# Patient Record
Sex: Female | Born: 1980
Health system: Southern US, Community
[De-identification: ages and names within clinical notes are randomized; demographics above are authoritative.]

## PROBLEM LIST (undated history)

## (undated) DIAGNOSIS — N3090 Cystitis, unspecified without hematuria: Secondary | ICD-10-CM

## (undated) DIAGNOSIS — L255 Unspecified contact dermatitis due to plants, except food: Secondary | ICD-10-CM

## (undated) HISTORY — DX: Cystitis, unspecified without hematuria: N30.90

## (undated) HISTORY — DX: Unspecified contact dermatitis due to plants, except food: L25.5

---

## 2003-11-18 HISTORY — PX: TONSILLECTOMY: SUR1361

## 2018-10-16 ENCOUNTER — Encounter: Payer: Self-pay | Admitting: Legal Medicine

## 2018-10-16 DIAGNOSIS — Z124 Encounter for screening for malignant neoplasm of cervix: Secondary | ICD-10-CM | POA: Diagnosis not present

## 2018-10-16 DIAGNOSIS — Z0000 Encounter for general adult medical examination without abnormal findings: Secondary | ICD-10-CM | POA: Diagnosis not present

## 2020-02-26 ENCOUNTER — Ambulatory Visit: Payer: 59 | Admitting: Legal Medicine

## 2020-02-26 ENCOUNTER — Other Ambulatory Visit: Payer: Self-pay

## 2020-02-26 ENCOUNTER — Encounter: Payer: Self-pay | Admitting: Legal Medicine

## 2020-02-26 VITALS — BP 110/68 | HR 86 | Temp 97.9°F | Ht 67.0 in | Wt 146.0 lb

## 2020-02-26 DIAGNOSIS — T63421A Toxic effect of venom of ants, accidental (unintentional), initial encounter: Secondary | ICD-10-CM

## 2020-02-26 DIAGNOSIS — T7840XA Allergy, unspecified, initial encounter: Secondary | ICD-10-CM

## 2020-02-26 MED ORDER — TRIAMCINOLONE ACETONIDE 40 MG/ML IJ SUSP
80.0000 mg | Freq: Once | INTRAMUSCULAR | Status: AC
  Administered 2020-02-26: 80 mg via INTRAMUSCULAR

## 2020-02-26 MED ORDER — PREDNISONE 10 MG PO TABS: 10.0000 mg | ORAL_TABLET | ORAL | 0 refills | Status: DC

## 2020-02-26 NOTE — Progress Notes (Signed)
Subjective:  Patient ID: Emily Holt, female    DOB: 11/22/1980  Age: 39 y.o. MRN: [id number]  Chief Complaint  Patient presents with  . Insect Bite    Left ankle    HPI: bit by fire ants yesterday on left ankle.  It is now swollen and having paresthesia. No respiratory embarrassment.   Current Outpatient Medications on File Prior to Visit  Medication Sig Dispense Refill  . Etonogestrel (NEXPLANON [place]) Inject into the skin.     No current facility-administered medications on file prior to visit.   History reviewed. No pertinent past medical history. History reviewed. No pertinent surgical history.  History reviewed. No pertinent family history. Social History   Socioeconomic History  . Marital status: Single    Spouse name: Not on file  . Number of children: Not on file  . Years of education: Not on file  . Highest education level: Not on file  Occupational History  . Not on file  Tobacco Use  . Smoking status: Never Smoker  . Smokeless tobacco: Never Used  Vaping Use  . Vaping Use: Never used  Substance and Sexual Activity  . Alcohol use: Yes  . Drug use: Never  . Sexual activity: Not on file  Other Topics Concern  . Not on file  Social History Narrative  . Not on file   Social Determinants of Health   Financial Resource Strain:   . Difficulty of Paying Living Expenses:   Food Insecurity:   . Worried About Programme researcher, broadcasting/film/video in the Last Year:   . Barista in the Last Year:   Transportation Needs:   . Freight forwarder (Medical):   Marland Kitchen Lack of Transportation (Non-Medical):   Physical Activity:   . Days of Exercise per Week:   . Minutes of Exercise per Session:   Stress:   . Feeling of Stress :   Social Connections:   . Frequency of Communication with Friends and Family:   . Frequency of Social Gatherings with Friends and Family:   . Attends Religious Services:   . Active Member of Clubs or Organizations:   . Attends Banker  Meetings:   Marland Kitchen Marital Status:     Review of Systems  Constitutional: Negative.   Eyes: Negative.   Cardiovascular: Negative.   Gastrointestinal: Negative.   Genitourinary: Negative.   Musculoskeletal: Negative.   Skin: Positive for rash.       Edema left anke     Objective:  BP 110/68   Pulse 86   Temp 97.9 F (36.6 C)   Ht 5\' 7"  (1.702 m)   Wt 146 lb (66.2 kg)   SpO2 96%   BMI 22.87 kg/m   BP/Weight 02/26/2020  Systolic BP 110  Diastolic BP 68  Wt. (Lbs) 146  BMI 22.87    Physical Exam Vitals reviewed.  Constitutional:      Appearance: Normal appearance.  HENT:     Head: Normocephalic and atraumatic.  Cardiovascular:     Rate and Rhythm: Normal rate and regular rhythm.     Pulses: Normal pulses.     Heart sounds: Normal heart sounds.  Pulmonary:     Effort: Pulmonary effort is normal.     Breath sounds: Normal breath sounds.  Musculoskeletal:        General: Swelling present. Normal range of motion.     Cervical back: Normal range of motion and neck supple.  Skin:    Findings: Erythema  present.  Neurological:     Mental Status: She is alert.     Diabetic Foot Exam - Simple   No data filed       No results found for: WBC, HGB, HCT, PLT, GLUCOSE, CHOL, TRIG, HDL, LDLDIRECT, LDLCALC, ALT, AST, NA, K, CL, CREATININE, BUN, CO2, TSH, PSA, INR, GLUF, HGBA1C, MICROALBUR    Assessment & Plan:   1. Allergic reaction, initial encounter - triamcinolone acetonide (KENALOG-40) injection 80 mg - predniSONE (DELTASONE) 10 MG tablet; Take 1 tablet (10 mg total) by mouth as directed. Take 6ills first day , then 5 pills day 2 and then cut down one pill day until gone  Dispense: 21 tablet; Refill: 0 AN INDIVIDUAL CARE PLAN was established and reinforced today.  The patient's status was assessed using clinical findings on exam, labs, and other diagnostic testing. Patient's success at meeting treatment goals based on disease specific evidence-bassed guidelines and  found to be in fair control. RECOMMENDATIONS include prednisone pack and application of cool compresses and keep ankle elevated over weekend.    2. Fire ant bite, accidental or unintentional, initial encounter - triamcinolone acetonide (KENALOG-40) injection 80 mg - predniSONE (DELTASONE) 10 MG tablet; Take 1 tablet (10 mg total) by mouth as directed. Take 6ills first day , then 5 pills day 2 and then cut down one pill day until gone  Dispense: 21 tablet; Refill: 0 Avoidance discussed treat ground for fire ants to avoid recurrence   Meds ordered this encounter  Medications  . triamcinolone acetonide (KENALOG-40) injection 80 mg  . predniSONE (DELTASONE) 10 MG tablet    Sig: Take 1 tablet (10 mg total) by mouth as directed. Take 6ills first day , then 5 pills day 2 and then cut down one pill day until gone    Dispense:  21 tablet    Refill:  0       Follow-up: Return if symptoms worsen or fail to improve.  An After Visit Summary was printed and given to the patient.  Brent Bulla [facility] [phone]

## 2020-02-29 DIAGNOSIS — T63421A Toxic effect of venom of ants, accidental (unintentional), initial encounter: Secondary | ICD-10-CM | POA: Insufficient documentation

## 2020-02-29 DIAGNOSIS — T7840XA Allergy, unspecified, initial encounter: Secondary | ICD-10-CM | POA: Insufficient documentation

## 2020-03-04 ENCOUNTER — Other Ambulatory Visit: Payer: Self-pay

## 2020-03-04 DIAGNOSIS — T7840XA Allergy, unspecified, initial encounter: Secondary | ICD-10-CM

## 2020-03-04 MED ORDER — EPINEPHRINE 0.3 MG/0.3ML IJ SOAJ: 0.3000 mg | Freq: Once | INTRAMUSCULAR | Status: DC

## 2020-11-10 ENCOUNTER — Other Ambulatory Visit: Payer: Self-pay | Admitting: Legal Medicine

## 2020-11-10 ENCOUNTER — Other Ambulatory Visit: Payer: Self-pay

## 2020-11-10 ENCOUNTER — Ambulatory Visit (INDEPENDENT_AMBULATORY_CARE_PROVIDER_SITE_OTHER): Payer: Commercial Managed Care - PPO | Admitting: Legal Medicine

## 2020-11-10 ENCOUNTER — Encounter: Payer: Self-pay | Admitting: Legal Medicine

## 2020-11-10 VITALS — BP 102/64 | HR 73 | Temp 98.0°F | Resp 16 | Wt 141.0 lb

## 2020-11-10 DIAGNOSIS — Z0000 Encounter for general adult medical examination without abnormal findings: Secondary | ICD-10-CM

## 2020-11-10 LAB — POCT URINALYSIS DIP (CLINITEK)
Bilirubin, UA: NEGATIVE
Blood, UA: NEGATIVE
Glucose, UA: NEGATIVE mg/dL
Leukocytes, UA: NEGATIVE
Nitrite, UA: NEGATIVE
POC PROTEIN,UA: NEGATIVE
Spec Grav, UA: 1.015 (ref 1.010–1.025)
Urobilinogen, UA: 0.2 E.U./dL
pH, UA: 5.5 (ref 5.0–8.0)

## 2020-11-10 MED ORDER — EPINEPHRINE (ANAPHYLAXIS) 1 MG/ML IJ SOLN
INTRAMUSCULAR | 1 refills | Status: DC
Start: 2020-11-10 — End: 2020-11-10

## 2020-11-10 NOTE — Patient Instructions (Signed)
Preventive Care 21-39 Years Old, Female Preventive care refers to lifestyle choices and visits with your health care provider that can promote health and wellness. This includes:  A yearly physical exam. This is also called an annual wellness visit.  Regular dental and eye exams.  Immunizations.  Screening for certain conditions.  Healthy lifestyle choices, such as: ? Eating a healthy diet. ? Getting regular exercise. ? Not using drugs or products that contain nicotine and tobacco. ? Limiting alcohol use. What can I expect for my preventive care visit? Physical exam Your health care provider may check your:  Height and weight. These may be used to calculate your BMI (body mass index). BMI is a measurement that tells if you are at a healthy weight.  Heart rate and blood pressure.  Body temperature.  Skin for abnormal spots. Counseling Your health care provider may ask you questions about your:  Past medical problems.  Family's medical history.  Alcohol, tobacco, and drug use.  Emotional well-being.  Home life and relationship well-being.  Sexual activity.  Diet, exercise, and sleep habits.  Work and work environment.  Access to firearms.  Method of birth control.  Menstrual cycle.  Pregnancy history. What immunizations do I need? Vaccines are usually given at various ages, according to a schedule. Your health care provider will recommend vaccines for you based on your age, medical history, and lifestyle or other factors, such as travel or where you work.   What tests do I need? Blood tests  Lipid and cholesterol levels. These may be checked every 5 years starting at age 20.  Hepatitis C test.  Hepatitis B test. Screening  Diabetes screening. This is done by checking your blood sugar (glucose) after you have not eaten for a while (fasting).  STD (sexually transmitted disease) testing, if you are at risk.  BRCA-related cancer screening. This may be  done if you have a family history of breast, ovarian, tubal, or peritoneal cancers.  Pelvic exam and Pap test. This may be done every 3 years starting at age 21. Starting at age 30, this may be done every 5 years if you have a Pap test in combination with an HPV test. Talk with your health care provider about your test results, treatment options, and if necessary, the need for more tests.   Follow these instructions at home: Eating and drinking  Eat a healthy diet that includes fresh fruits and vegetables, whole grains, lean protein, and low-fat dairy products.  Take vitamin and mineral supplements as recommended by your health care provider.  Do not drink alcohol if: ? Your health care provider tells you not to drink. ? You are pregnant, may be pregnant, or are planning to become pregnant.  If you drink alcohol: ? Limit how much you have to 0-1 drink a day. ? Be aware of how much alcohol is in your drink. In the [place] one drink equals one 12 oz bottle of beer (355 mL), one 5 oz glass of wine (148 mL), or one 1 oz glass of hard liquor (44 mL).   Lifestyle  Take daily care of your teeth and gums. Brush your teeth every morning and night with fluoride toothpaste. Floss one time each day.  Stay active. Exercise for at least 30 minutes 5 or more days each week.  Do not use any products that contain nicotine or tobacco, such as cigarettes, e-cigarettes, and chewing tobacco. If you need help quitting, ask your health care provider.  Do not   use drugs.  If you are sexually active, practice safe sex. Use a condom or other form of protection to prevent STIs (sexually transmitted infections).  If you do not wish to become pregnant, use a form of birth control. If you plan to become pregnant, see your health care provider for a prepregnancy visit.  Find healthy ways to cope with stress, such as: ? Meditation, yoga, or listening to music. ? Journaling. ? Talking to a trusted  person. ? Spending time with friends and family. Safety  Always wear your seat belt while driving or riding in a vehicle.  Do not drive: ? If you have been drinking alcohol. Do not ride with someone who has been drinking. ? When you are tired or distracted. ? While texting.  Wear a helmet and other protective equipment during sports activities.  If you have firearms in your house, make sure you follow all gun safety procedures.  Seek help if you have been physically or sexually abused. What's next?  Go to your health care provider once a year for an annual wellness visit.  Ask your health care provider how often you should have your eyes and teeth checked.  Stay up to date on all vaccines. This information is not intended to replace advice given to you by your health care provider. Make sure you discuss any questions you have with your health care provider. Document Revised: 03/21/2020 Document Reviewed: 04/04/2018 Elsevier Patient Education  2021 [employer].

## 2020-11-10 NOTE — Progress Notes (Signed)
Subjective:  Patient ID: Emily Holt, female    DOB: 12-Sep-1980  Age: 40 y.o. MRN: [id number]  Chief Complaint  Patient presents with  . Annual Exam    HPI: patient is healthy, on nexplanon but no other medicines.  She works for [employer] and having foot pain due to rigid shoes.  She is having shin splints and pain on extensor aspect of ankles.  Needs alternate shoes.  She is a runner .   Current Outpatient Medications on File Prior to Visit  Medication Sig Dispense Refill  . Etonogestrel (NEXPLANON [place]) Inject into the skin.     No current facility-administered medications on file prior to visit.   Past Medical History:  Diagnosis Date  . Cystitis   . Dermatitis due to plant    Past Surgical History:  Procedure Laterality Date  . TONSILLECTOMY  11/18/2003    Family History  Problem Relation Age of Onset  . Hypertension Mother   . Diabetes Maternal Grandmother   . Diabetes Maternal Grandfather   . Colon cancer Maternal Grandfather    Social History   Socioeconomic History  . Marital status: Single    Spouse name: Not on file  . Number of children: 0  . Years of education: Not on file  . Highest education level: Not on file  Occupational History  . Not on file  Tobacco Use  . Smoking status: Never Smoker  . Smokeless tobacco: Never Used  Vaping Use  . Vaping Use: Never used  Substance and Sexual Activity  . Alcohol use: Yes    Alcohol/week: 2.0 standard drinks    Types: 2 Cans of beer per week    Comment: week  . Drug use: Never  . Sexual activity: Yes    Partners: Male  Other Topics Concern  . Not on file  Social History Narrative  . Not on file   Social Determinants of Health   Financial Resource Strain: Not on file  Food Insecurity: Not on file  Transportation Needs: Not on file  Physical Activity: Not on file  Stress: Not on file  Social Connections: Not on file    Review of Systems  Constitutional: Negative for activity change and  appetite change.  HENT: Negative for congestion.   Eyes: Negative for visual disturbance.  Respiratory: Negative for chest tightness and shortness of breath.   Cardiovascular: Negative for chest pain, palpitations and leg swelling.  Gastrointestinal: Negative.  Negative for abdominal distention and abdominal pain.  Endocrine: Negative for polyuria.  Genitourinary: Negative for difficulty urinating and dyspareunia.  Musculoskeletal: Negative for arthralgias and back pain.  Neurological: Negative.   Psychiatric/Behavioral: Negative.      Objective:  BP 102/64   Pulse 73   Temp 98 F (36.7 C)   Resp 16   Wt 141 lb (64 kg)   SpO2 99%   BMI 22.08 kg/m   BP/Weight 11/10/2020 02/26/2020  Systolic BP 102 110  Diastolic BP 64 68  Wt. (Lbs) 141 146  BMI 22.08 22.87    Physical Exam Vitals reviewed.  Constitutional:      Appearance: Normal appearance.  HENT:     Head: Normocephalic.     Right Ear: Tympanic membrane normal.     Left Ear: Tympanic membrane normal.     Nose: Nose normal.     Mouth/Throat:     Mouth: Mucous membranes are moist.     Pharynx: Oropharynx is clear.  Eyes:     Extraocular Movements:  Extraocular movements intact.     Conjunctiva/sclera: Conjunctivae normal.     Pupils: Pupils are equal, round, and reactive to light.  Cardiovascular:     Rate and Rhythm: Normal rate and regular rhythm.     Pulses: Normal pulses.     Heart sounds: Normal heart sounds. No murmur heard. No gallop.   Pulmonary:     Effort: Pulmonary effort is normal. No respiratory distress.     Breath sounds: Normal breath sounds. No rales.  Abdominal:     General: Abdomen is flat. Bowel sounds are normal. There is no distension.     Palpations: Abdomen is soft.     Tenderness: There is no abdominal tenderness.  Musculoskeletal:        General: Normal range of motion.     Cervical back: Normal range of motion and neck supple.       Legs:     Comments: Early ulcers on dorsum of  feet due to work shoes.  Skin:    General: Skin is warm and dry.     Capillary Refill: Capillary refill takes less than 2 seconds.  Neurological:     General: No focal deficit present.     Mental Status: She is alert and oriented to person, place, and time.  Psychiatric:        Mood and Affect: Mood normal.        Behavior: Behavior normal.        Thought Content: Thought content normal.       No results found for: WBC, HGB, HCT, PLT, GLUCOSE, CHOL, TRIG, HDL, LDLDIRECT, LDLCALC, ALT, AST, NA, K, CL, CREATININE, BUN, CO2, TSH, PSA, INR, GLUF, HGBA1C, MICROALBUR    Assessment & Plan:   Diagnoses and all orders for this visit: Routine general medical examination at a health care facility -     POCT URINALYSIS DIP (CLINITEK) -     CBC with Differential/Platelet -     Comprehensive metabolic panel -     Lipid panel Patient had physical, does not need pelvic.  Mammogram scheduled.         Follow-up: Return if symptoms worsen or fail to improve.  An After Visit Summary was printed and given to the patient.  Brent Bulla, MD [facility] [phone]

## 2020-11-11 LAB — CBC WITH DIFFERENTIAL/PLATELET
Basophils Absolute: 0.1 10*3/uL (ref 0.0–0.2)
Basos: 1 %
EOS (ABSOLUTE): 0.2 10*3/uL (ref 0.0–0.4)
Eos: 2 %
Hematocrit: 37.6 % (ref 34.0–46.6)
Hemoglobin: 12.6 g/dL (ref 11.1–15.9)
Immature Grans (Abs): 0 10*3/uL (ref 0.0–0.1)
Immature Granulocytes: 0 %
Lymphocytes Absolute: 3.3 10*3/uL — ABNORMAL HIGH (ref 0.7–3.1)
Lymphs: 38 %
MCH: 31.4 pg (ref 26.6–33.0)
MCHC: 33.5 g/dL (ref 31.5–35.7)
MCV: 94 fL (ref 79–97)
Monocytes Absolute: 0.6 10*3/uL (ref 0.1–0.9)
Monocytes: 7 %
Neutrophils Absolute: 4.7 10*3/uL (ref 1.4–7.0)
Neutrophils: 52 %
Platelets: 303 10*3/uL (ref 150–450)
RBC: 4.01 x10E6/uL (ref 3.77–5.28)
RDW: 12.8 % (ref 11.7–15.4)
WBC: 8.9 10*3/uL (ref 3.4–10.8)

## 2020-11-11 LAB — LIPID PANEL
Chol/HDL Ratio: 1.5 ratio (ref 0.0–4.4)
Cholesterol, Total: 153 mg/dL (ref 100–199)
HDL: 104 mg/dL (ref >39)
LDL Chol Calc (NIH): 39 mg/dL (ref 0–99)
Triglycerides: 43 mg/dL (ref 0–149)
VLDL Cholesterol Cal: 10 mg/dL (ref 5–40)

## 2020-11-11 LAB — COMPREHENSIVE METABOLIC PANEL
ALT: 14 IU/L (ref 0–32)
AST: 19 IU/L (ref 0–40)
Albumin/Globulin Ratio: 2.2 (ref 1.2–2.2)
Albumin: 4.8 g/dL (ref 3.8–4.8)
Alkaline Phosphatase: 57 IU/L (ref 44–121)
BUN/Creatinine Ratio: 16 (ref 9–23)
BUN: 11 mg/dL (ref 6–20)
Bilirubin Total: 0.6 mg/dL (ref 0.0–1.2)
CO2: 24 mmol/L (ref 20–29)
Calcium: 9.5 mg/dL (ref 8.7–10.2)
Chloride: 97 mmol/L (ref 96–106)
Creatinine, Ser: 0.68 mg/dL (ref 0.57–1.00)
Globulin, Total: 2.2 g/dL (ref 1.5–4.5)
Glucose: 82 mg/dL (ref 65–99)
Potassium: 4.7 mmol/L (ref 3.5–5.2)
Sodium: 136 mmol/L (ref 134–144)
Total Protein: 7 g/dL (ref 6.0–8.5)
eGFR: 115 mL/min/{1.73_m2} (ref >59)

## 2020-11-11 LAB — CARDIOVASCULAR RISK ASSESSMENT

## 2020-11-11 NOTE — Progress Notes (Signed)
CBC normal, kidney and liver tests normal, glucose 82 normal, Cholesterol normal,  lp

## 2020-11-29 ENCOUNTER — Ambulatory Visit
Admission: RE | Admit: 2020-11-29 | Discharge: 2020-11-29 | Disposition: A | Discharge status: Home / Self Care | Payer: Commercial Managed Care - PPO | Source: Ambulatory Visit | Attending: Legal Medicine | Admitting: Legal Medicine

## 2020-11-29 ENCOUNTER — Other Ambulatory Visit: Payer: Self-pay

## 2020-11-29 ENCOUNTER — Other Ambulatory Visit: Payer: Self-pay | Admitting: Legal Medicine

## 2020-11-29 DIAGNOSIS — Z1231 Encounter for screening mammogram for malignant neoplasm of breast: Secondary | ICD-10-CM

## 2020-11-30 NOTE — Progress Notes (Signed)
Normal mammogram ?lp

## 2020-12-20 ENCOUNTER — Other Ambulatory Visit: Payer: Self-pay

## 2020-12-20 ENCOUNTER — Encounter: Payer: Self-pay | Admitting: Legal Medicine

## 2020-12-20 ENCOUNTER — Ambulatory Visit: Payer: Commercial Managed Care - PPO | Admitting: Legal Medicine

## 2020-12-20 VITALS — BP 104/68 | HR 80 | Temp 97.5°F | Resp 16 | Ht 67.0 in | Wt 142.0 lb

## 2020-12-20 DIAGNOSIS — M545 Low back pain, unspecified: Secondary | ICD-10-CM | POA: Diagnosis not present

## 2020-12-20 MED ORDER — PREDNISONE 10 MG (21) PO TBPK: ORAL_TABLET | ORAL | 0 refills | Status: DC

## 2020-12-20 NOTE — Patient Instructions (Signed)

## 2020-12-20 NOTE — Progress Notes (Signed)
Subjective:  Patient ID: Emily Holt, female    DOB: 1981/05/14  Age: 40 y.o. MRN: [id number]  Chief Complaint  Patient presents with  . Back Pain    HPI: 2 years ago "pinch" in back.  Since [place] , she gets sharp pain in mid lumbar area.  No weakness or numbness in legs.  No bladder or bowel problems. No injury. She is unable to run due to pain.   Current Outpatient Medications on File Prior to Visit  Medication Sig Dispense Refill  . EPINEPHrine (ADRENALIN) 1 MG/ML SOLN INJECT 1 MG/ML INTRAMUSCULAR FOR ANAPHYLAXIS AS DIRECTED. 1 mL 3  . Etonogestrel (NEXPLANON [place]) Inject into the skin.     No current facility-administered medications on file prior to visit.   Past Medical History:  Diagnosis Date  . Cystitis   . Dermatitis due to plant    Past Surgical History:  Procedure Laterality Date  . TONSILLECTOMY  11/18/2003    Family History  Problem Relation Age of Onset  . Hypertension Mother   . Diabetes Maternal Grandmother   . Diabetes Maternal Grandfather   . Colon cancer Maternal Grandfather    Social History   Socioeconomic History  . Marital status: Single    Spouse name: Not on file  . Number of children: 0  . Years of education: Not on file  . Highest education level: Not on file  Occupational History  . Not on file  Tobacco Use  . Smoking status: Never Smoker  . Smokeless tobacco: Never Used  Vaping Use  . Vaping Use: Never used  Substance and Sexual Activity  . Alcohol use: Yes    Alcohol/week: 2.0 standard drinks    Types: 2 Cans of beer per week    Comment: week  . Drug use: Never  . Sexual activity: Yes    Partners: Male  Other Topics Concern  . Not on file  Social History Narrative  . Not on file   Social Determinants of Health   Financial Resource Strain: Not on file  Food Insecurity: Not on file  Transportation Needs: Not on file  Physical Activity: Not on file  Stress: Not on file  Social Connections: Not on file    Review  of Systems  Constitutional: Negative for activity change and appetite change.  HENT: Negative for congestion and sinus pain.   Eyes: Negative for visual disturbance.  Respiratory: Negative for chest tightness and shortness of breath.   Cardiovascular: Negative for chest pain, palpitations and leg swelling.  Gastrointestinal: Negative for abdominal distention and abdominal pain.  Endocrine: Negative for polyuria.  Genitourinary: Negative for difficulty urinating and dysuria.  Musculoskeletal: Negative for arthralgias and back pain.  Skin: Negative.   Neurological: Negative.   Psychiatric/Behavioral: Negative.      Objective:  BP 104/68   Pulse 80   Temp (!) 97.5 F (36.4 C)   Resp 16   Ht 5\' 7"  (1.702 m)   Wt 142 lb (64.4 kg)   SpO2 94%   BMI 22.24 kg/m   BP/Weight 12/20/2020 11/10/2020 02/26/2020  Systolic BP 104 102 110  Diastolic BP 68 64 68  Wt. (Lbs) 142 141 146  BMI 22.24 22.08 22.87    Physical Exam Vitals reviewed.  Constitutional:      Appearance: Normal appearance. She is normal weight.  HENT:     Head: Normocephalic.     Right Ear: Tympanic membrane, ear canal and external ear normal.     Left Ear:  Tympanic membrane and external ear normal.     Mouth/Throat:     Mouth: Mucous membranes are moist.     Pharynx: Oropharynx is clear.  Eyes:     Extraocular Movements: Extraocular movements intact.     Conjunctiva/sclera: Conjunctivae normal.     Pupils: Pupils are equal, round, and reactive to light.  Cardiovascular:     Rate and Rhythm: Normal rate and regular rhythm.     Pulses: Normal pulses.     Heart sounds: Normal heart sounds. No murmur heard. No gallop.   Pulmonary:     Effort: Pulmonary effort is normal. No respiratory distress.     Breath sounds: No rales.  Abdominal:     General: Abdomen is flat. Bowel sounds are normal. There is no distension.     Palpations: Abdomen is soft.     Tenderness: There is no abdominal tenderness.  Musculoskeletal:      Cervical back: Normal range of motion.     Lumbar back: Spasms and tenderness present. No bony tenderness. Normal range of motion. Negative right straight leg raise test and negative left straight leg raise test. No scoliosis.       Back:     Comments: Full ROM back flexion 20 degrees and extension 10 degrees  Skin:    Capillary Refill: Capillary refill takes less than 2 seconds.  Neurological:     General: No focal deficit present.     Mental Status: She is alert and oriented to person, place, and time.       Lab Results  Component Value Date   WBC 8.9 11/10/2020   HGB 12.6 11/10/2020   HCT 37.6 11/10/2020   PLT 303 11/10/2020   GLUCOSE 82 11/10/2020   CHOL 153 11/10/2020   TRIG 43 11/10/2020   HDL 104 11/10/2020   LDLCALC 39 11/10/2020   ALT 14 11/10/2020   AST 19 11/10/2020   NA 136 11/10/2020   K 4.7 11/10/2020   CL 97 11/10/2020   CREATININE 0.68 11/10/2020   BUN 11 11/10/2020   CO2 24 11/10/2020      Assessment & Plan:   Diagnoses and all orders for this visit: Lumbar pain -     DG Lumbar Spine Complete -     predniSONE (STERAPRED UNI-PAK 21 TAB) 10 MG (21) TBPK tablet; Take 6ills first day , then 5 pills day 2 and then cut down one pill day until gone I suspect a nerve impingement with her sharp pain.  No radiation or neurologic defects.  X-ray was negative of LS spine     Follow-up: Return in about 1 week (around 12/27/2020) for back pain.  An After Visit Summary was printed and given to the patient.  Brent Bulla, MD [facility] [phone]

## 2020-12-27 ENCOUNTER — Ambulatory Visit: Payer: Commercial Managed Care - PPO | Admitting: Legal Medicine

## 2021-05-11 ENCOUNTER — Encounter: Payer: Self-pay | Admitting: Physician Assistant

## 2021-05-11 ENCOUNTER — Ambulatory Visit: Payer: Commercial Managed Care - PPO | Admitting: Physician Assistant

## 2021-05-11 ENCOUNTER — Other Ambulatory Visit: Payer: Self-pay

## 2021-05-11 VITALS — BP 116/70 | HR 80 | Temp 96.4°F | Ht 67.0 in | Wt 146.6 lb

## 2021-05-11 DIAGNOSIS — A630 Anogenital (venereal) warts: Secondary | ICD-10-CM | POA: Diagnosis not present

## 2021-05-11 MED ORDER — PODOFILOX 0.5 % EX SOLN: CUTANEOUS | 0 refills | Status: DC

## 2021-05-11 NOTE — Progress Notes (Signed)
Acute Office Visit  Subjective:    Patient ID: Emily Holt, female    DOB: 02-20-1981, 40 y.o.   MRN: [id number]  Chief Complaint  Patient presents with   Genital Warts    HPI Patient is in today for complaints of genital warts - states she was diagnosed about 10-12 years ago and treated with topical meds - had no recurrence until now She does not have a new sexual partner and he has no symptoms She is up to date on pap with last one in 2020 and negative for HPV Pt does states that she shaves her vaginal area and the initial lesions started higher on mons pubis area - these are more flat also  Past Medical History:  Diagnosis Date   Cystitis    Dermatitis due to plant     Past Surgical History:  Procedure Laterality Date   TONSILLECTOMY  11/18/2003    Family History  Problem Relation Age of Onset   Hypertension Mother    Diabetes Maternal Grandmother    Diabetes Maternal Grandfather    Colon cancer Maternal Grandfather     Social History   Socioeconomic History   Marital status: Single    Spouse name: Not on file   Number of children: 0   Years of education: Not on file   Highest education level: Not on file  Occupational History   Not on file  Tobacco Use   Smoking status: Never   Smokeless tobacco: Never  Vaping Use   Vaping Use: Never used  Substance and Sexual Activity   Alcohol use: Yes    Alcohol/week: 2.0 standard drinks    Types: 2 Cans of beer per week    Comment: week   Drug use: Never   Sexual activity: Yes    Partners: Male  Other Topics Concern   Not on file  Social History Narrative   Not on file   Social Determinants of Health   Financial Resource Strain: Not on file  Food Insecurity: Not on file  Transportation Needs: Not on file  Physical Activity: Not on file  Stress: Not on file  Social Connections: Not on file  Intimate Partner Violence: Not on file    Outpatient Medications Prior to Visit  Medication Sig Dispense  Refill   EPINEPHrine (ADRENALIN) 1 MG/ML SOLN INJECT 1 MG/ML INTRAMUSCULAR FOR ANAPHYLAXIS AS DIRECTED. 1 mL 3   Etonogestrel (NEXPLANON [place]) Inject into the skin.     predniSONE (STERAPRED UNI-PAK 21 TAB) 10 MG (21) TBPK tablet Take 6ills first day , then 5 pills day 2 and then cut down one pill day until gone 21 tablet 0   No facility-administered medications prior to visit.    No Known Allergies  Review of Systems CONSTITUTIONAL: Negative for chills, fatigue, fever, unintentional weight gain and unintentional weight loss.  CARDIOVASCULAR: Negative for chest pain, dizziness, palpitations and pedal edema.  RESPIRATORY: Negative for recent cough and dyspnea.  GU - no vaginal discharge or itching --- skin lesions as mentioned above        Objective:    Physical Exam  BP 116/70 (BP Location: Right Arm, Patient Position: Sitting, Cuff Size: Normal)   Pulse 80   Temp (!) 96.4 F (35.8 C) (Temporal)   Ht '5\' 7"'  (1.702 m)   Wt 146 lb 9.6 oz (66.5 kg)   SpO2 95%   BMI 22.96 kg/m  Wt Readings from Last 3 Encounters:  05/11/21 146 lb 9.6 oz (66.5 kg)  12/20/20 142 lb (64.4 kg)  11/10/20 141 lb (64 kg)  PHYSICAL EXAM:   VS: BP 116/70 (BP Location: Right Arm, Patient Position: Sitting, Cuff Size: Normal)   Pulse 80   Temp (!) 96.4 F (35.8 C) (Temporal)   Ht '5\' 7"'  (1.702 m)   Wt 146 lb 9.6 oz (66.5 kg)   SpO2 95%   BMI 22.96 kg/m   GEN: Well nourished, well developed, in no acute distress  Cardiac: RRR; no murmurs, rubs, or gallops,no edema -  Respiratory:  normal respiratory rate and pattern with no distress - normal breath sounds with no rales, rhonchi, wheezes or rubs GU - small flat white planar wart like lesions noted around labia ---    Health Maintenance Due  Topic Date Due   HIV Screening  Never done   Hepatitis C Screening  Never done   TETANUS/TDAP  Never done   INFLUENZA VACCINE  Never done    There are no preventive care reminders to display for this  patient.   No results found for: TSH Lab Results  Component Value Date   WBC 8.9 11/10/2020   HGB 12.6 11/10/2020   HCT 37.6 11/10/2020   MCV 94 11/10/2020   PLT 303 11/10/2020   Lab Results  Component Value Date   NA 136 11/10/2020   K 4.7 11/10/2020   CO2 24 11/10/2020   GLUCOSE 82 11/10/2020   BUN 11 11/10/2020   CREATININE 0.68 11/10/2020   BILITOT 0.6 11/10/2020   ALKPHOS 57 11/10/2020   AST 19 11/10/2020   ALT 14 11/10/2020   PROT 7.0 11/10/2020   ALBUMIN 4.8 11/10/2020   CALCIUM 9.5 11/10/2020   EGFR 115 11/10/2020   Lab Results  Component Value Date   CHOL 153 11/10/2020   Lab Results  Component Value Date   HDL 104 11/10/2020   Lab Results  Component Value Date   LDLCALC 39 11/10/2020   Lab Results  Component Value Date   TRIG 43 11/10/2020   Lab Results  Component Value Date   CHOLHDL 1.5 11/10/2020   No results found for: HGBA1C     Assessment & Plan:   Problem List Items Addressed This Visit   None Visit Diagnoses     Genital warts    -  Primary   Relevant Medications   podofilox (CONDYLOX) 0.5 % external solution      Meds ordered this encounter  Medications   podofilox (CONDYLOX) 0.5 % external solution    Sig: Apply topically every 12 hours in the morning and evening for 3 days, then withhold for 4 days; repeat cycle up to 4 times    Dispense:  3.5 mL    Refill:  0    Order Specific Question:   Supervising Provider    Answer:   Rochel Brome [id number]    No orders of the defined types were placed in this encounter.    Follow-up: Return if symptoms worsen or fail to improve.  An After Visit Summary was printed and given to the patient.  Yetta Flock [facility] [phone]

## 2021-08-29 ENCOUNTER — Telehealth (INDEPENDENT_AMBULATORY_CARE_PROVIDER_SITE_OTHER): Payer: Commercial Managed Care - PPO | Admitting: Nurse Practitioner

## 2021-08-29 ENCOUNTER — Encounter: Payer: Self-pay | Admitting: Nurse Practitioner

## 2021-08-29 DIAGNOSIS — R509 Fever, unspecified: Secondary | ICD-10-CM | POA: Diagnosis not present

## 2021-08-29 DIAGNOSIS — U071 COVID-19: Secondary | ICD-10-CM

## 2021-08-29 DIAGNOSIS — R112 Nausea with vomiting, unspecified: Secondary | ICD-10-CM

## 2021-08-29 LAB — POCT RAPID STREP A (OFFICE): Rapid Strep A Screen: NEGATIVE

## 2021-08-29 LAB — POC COVID19 BINAXNOW: SARS Coronavirus 2 Ag: POSITIVE — AB

## 2021-08-29 LAB — POCT INFLUENZA A/B
Influenza A, POC: NEGATIVE
Influenza B, POC: NEGATIVE

## 2021-08-29 MED ORDER — IBUPROFEN 800 MG PO TABS: 800.0000 mg | ORAL_TABLET | Freq: Three times a day (TID) | ORAL | 0 refills | Status: DC | PRN

## 2021-08-29 MED ORDER — NIRMATRELVIR/RITONAVIR (PAXLOVID)TABLET: 3.0000 | ORAL_TABLET | Freq: Two times a day (BID) | ORAL | 0 refills | Status: AC

## 2021-08-29 MED ORDER — ONDANSETRON HCL 4 MG PO TABS: 4.0000 mg | ORAL_TABLET | Freq: Three times a day (TID) | ORAL | 0 refills | Status: DC | PRN

## 2021-08-29 NOTE — Progress Notes (Signed)
Virtual Visit via Video Note   This visit type was conducted due to national recommendations for restrictions regarding the COVID-19 Pandemic (e.g. social distancing) in an effort to limit this patient's exposure and mitigate transmission in our community.  Due to her co-morbid illnesses, this patient is at least at moderate risk for complications without adequate follow up.  This format is felt to be most appropriate for this patient at this time.  All issues noted in this document were discussed and addressed.  A limited physical exam was performed with this format.  A verbal consent was obtained for the virtual visit.   Date:  08/29/2021   ID:  Emily Holt, DOB [birth year], MRN [id number]  Patient Location: Other:  [facility]  Provider Location: Office/Clinic  PCP:  Abigail Miyamoto, MD   Evaluation Performed:  Established patient, acute telemedicine visit  Chief Complaint:  fever  History of Present Illness:    Emily Holt is a 41 y.o. female with fever, nausea, vomiting, and back pain. Onset of symptoms was yesterday. Treatment has included Ibuprofen.   The patient does have symptoms concerning for COVID-19 infection (fever, chills, cough, or new shortness of breath).    Past Medical History:  Diagnosis Date   Cystitis    Dermatitis due to plant     Past Surgical History:  Procedure Laterality Date   TONSILLECTOMY  11/18/2003    Family History  Problem Relation Age of Onset   Hypertension Mother    Diabetes Maternal Grandmother    Diabetes Maternal Grandfather    Colon cancer Maternal Grandfather     Social History   Socioeconomic History   Marital status: Single    Spouse name: Not on file   Number of children: 0   Years of education: Not on file   Highest education level: Not on file  Occupational History   Not on file  Tobacco Use   Smoking status: Never   Smokeless tobacco: Never  Vaping Use   Vaping Use: Never used  Substance  and Sexual Activity   Alcohol use: Yes    Alcohol/week: 2.0 standard drinks    Types: 2 Cans of beer per week    Comment: week   Drug use: Never   Sexual activity: Yes    Partners: Male  Other Topics Concern   Not on file  Social History Narrative   Not on file   Social Determinants of Health   Financial Resource Strain: Not on file  Food Insecurity: Not on file  Transportation Needs: Not on file  Physical Activity: Not on file  Stress: Not on file  Social Connections: Not on file  Intimate Partner Violence: Not on file    Outpatient Medications Prior to Visit  Medication Sig Dispense Refill   EPINEPHrine (ADRENALIN) 1 MG/ML SOLN INJECT 1 MG/ML INTRAMUSCULAR FOR ANAPHYLAXIS AS DIRECTED. 1 mL 3   Etonogestrel (NEXPLANON [place]) Inject into the skin.     podofilox (CONDYLOX) 0.5 % external solution Apply topically every 12 hours in the morning and evening for 3 days, then withhold for 4 days; repeat cycle up to 4 times 3.5 mL 0   predniSONE (STERAPRED UNI-PAK 21 TAB) 10 MG (21) TBPK tablet Take 6ills first day , then 5 pills day 2 and then cut down one pill day until gone 21 tablet 0   No facility-administered medications prior to visit.    Allergies:   Patient has no known allergies.   Social History  Tobacco Use   Smoking status: Never   Smokeless tobacco: Never  Vaping Use   Vaping Use: Never used  Substance Use Topics   Alcohol use: Yes    Alcohol/week: 2.0 standard drinks    Types: 2 Cans of beer per week    Comment: week   Drug use: Never     Review of Systems  Constitutional:  Positive for chills, fever and malaise/fatigue.  Gastrointestinal:  Positive for nausea and vomiting.  Musculoskeletal:  Positive for back pain.  All other systems reviewed and are negative.   Labs/Other Tests and Data Reviewed:    Recent Labs: 11/10/2020: ALT 14; BUN 11; Creatinine, Ser 0.68; Hemoglobin 12.6; Platelets 303; Potassium 4.7; Sodium 136   Recent Lipid Panel Lab  Results  Component Value Date/Time   CHOL 153 11/10/2020 02:35 PM   TRIG 43 11/10/2020 02:35 PM   HDL 104 11/10/2020 02:35 PM   CHOLHDL 1.5 11/10/2020 02:35 PM   LDLCALC 39 11/10/2020 02:35 PM    Wt Readings from Last 3 Encounters:  05/11/21 146 lb 9.6 oz (66.5 kg)  12/20/20 142 lb (64.4 kg)  11/10/20 141 lb (64 kg)     Objective:    Vital Signs:  There were no vitals taken for this visit.   Physical Exam No physical exam due to telemedicine visit  ASSESSMENT & PLAN:       1. COVID-19 - nirmatrelvir/ritonavir EUA (PAXLOVID) 20 x 150 MG & 10 x 100MG  TABS; Take 3 tablets by mouth 2 (two) times daily for 5 days. (Take nirmatrelvir 150 mg two tablets twice daily for 5 days and ritonavir 100 mg one tablet twice daily for 5 days) Patient GFR is 115  Dispense: 30 tablet; Refill: 0  2. Fever, unspecified fever cause - POC COVID-19 BinaxNow-POSITIVE - POCT Influenza A/B-NEGATIVE - POCT rapid strep A-NEGATIVE - ibuprofen (ADVIL) 800 MG tablet; Take 1 tablet (800 mg total) by mouth every 8 (eight) hours as needed.  Dispense: 30 tablet; Refill: 0  3. Nausea and vomiting, unspecified vomiting type - ondansetron (ZOFRAN) 4 MG tablet; Take 1 tablet (4 mg total) by mouth every 8 (eight) hours as needed for nausea or vomiting.  Dispense: 20 tablet; Refill: 0    Rest and push fluids Seek emergency medical care for severe symptoms Continue Ibuprofen as directed for fever, back pain Take OTC cold remedies for URI symptoms such as Mucinex Follow-up as needed    COVID-19 Education: The signs and symptoms of COVID-19 were discussed with the patient and how to seek care for testing (follow up with PCP or arrange E-visit). The importance of social distancing was discussed today.   I spent 10 minutes dedicated to the care of this patient on the date of this encounter to include face-to-face time with the patient, as well as: EMR review and prescription medication management  Follow Up:  In  Person prn  Signed, , NP  08/29/2021 2:49 PM    [facility] [place]

## 2021-09-02 ENCOUNTER — Telehealth: Payer: Self-pay

## 2021-09-02 NOTE — Telephone Encounter (Signed)
Made patient aware.   Lorita Officer, CCMA 09/02/21 11:14 AM

## 2021-09-02 NOTE — Telephone Encounter (Signed)
Patient was dx w/ covid on 1/23 was given paxlovid and ibuprofen. She has been taking these. Overall symptoms have decreased but throat pain has worsened. Ibuprofen does not help. Please advise.   Lorita Officer, CCMA 09/02/21 8:08 AM

## 2022-04-13 ENCOUNTER — Encounter: Payer: Self-pay | Admitting: Nurse Practitioner

## 2022-04-13 ENCOUNTER — Ambulatory Visit: Payer: Commercial Managed Care - PPO | Admitting: Nurse Practitioner

## 2022-04-13 VITALS — BP 108/68 | HR 68 | Temp 97.9°F | Ht 67.0 in | Wt 139.0 lb

## 2022-04-13 DIAGNOSIS — J018 Other acute sinusitis: Secondary | ICD-10-CM | POA: Diagnosis not present

## 2022-04-13 DIAGNOSIS — J302 Other seasonal allergic rhinitis: Secondary | ICD-10-CM | POA: Diagnosis not present

## 2022-04-13 MED ORDER — AZITHROMYCIN 250 MG PO TABS: ORAL_TABLET | ORAL | 0 refills | Status: AC

## 2022-04-13 MED ORDER — FLUTICASONE PROPIONATE 50 MCG/ACT NA SUSP: 2.0000 | Freq: Every day | NASAL | 6 refills | Status: DC

## 2022-04-13 NOTE — Progress Notes (Signed)
Acute Office Visit  Subjective:    Patient ID: Emily Holt, female    DOB: [birth year], 41 y.o.   MRN: [id number]  Chief Complaint  Patient presents with   Sinusitis   HPI:  Sinusitis Associated symptoms include congestion, ear pain, headaches, sinus pressure and a sore throat. Pertinent negatives include no coughing or shortness of breath.   Patient is in today for sinus congestion/pressure, headache, sore throat, and bilateral ear pain. Onset of symptoms was 6 days ago. Treatment has included Zyrtec and Ibuprofen. Denies fever, chills, rash, or body aches. Denies known exposure to ill contacts. She works outdoors as a Dispensing optician. States she has chronic allergic rhinitis.   Past Medical History:  Diagnosis Date   Cystitis    Dermatitis due to plant     Past Surgical History:  Procedure Laterality Date   TONSILLECTOMY  11/18/2003    Family History  Problem Relation Age of Onset   Hypertension Mother    Diabetes Maternal Grandmother    Diabetes Maternal Grandfather    Colon cancer Maternal Grandfather     Social History   Socioeconomic History   Marital status: Single    Spouse name: Not on file   Number of children: 0   Years of education: Not on file   Highest education level: Not on file  Occupational History   Not on file  Tobacco Use   Smoking status: Never   Smokeless tobacco: Never  Vaping Use   Vaping Use: Never used  Substance and Sexual Activity   Alcohol use: Yes    Alcohol/week: 2.0 standard drinks of alcohol    Types: 2 Cans of beer per week    Comment: week   Drug use: Never   Sexual activity: Yes    Partners: Male  Other Topics Concern   Not on file  Social History Narrative   Not on file   Social Determinants of Health   Financial Resource Strain: Not on file  Food Insecurity: Not on file  Transportation Needs: Not on file  Physical Activity: Not on file  Stress: Not on file  Social Connections: Not on file  Intimate  Partner Violence: Not on file    Outpatient Medications Prior to Visit  Medication Sig Dispense Refill   EPINEPHrine (ADRENALIN) 1 MG/ML SOLN INJECT 1 MG/ML INTRAMUSCULAR FOR ANAPHYLAXIS AS DIRECTED. 1 mL 3   Etonogestrel (NEXPLANON [place]) Inject into the skin.     ibuprofen (ADVIL) 800 MG tablet Take 1 tablet (800 mg total) by mouth every 8 (eight) hours as needed. 30 tablet 0   nitrofurantoin, macrocrystal-monohydrate, (MACROBID) 100 MG capsule Take 100 mg by mouth 2 (two) times daily.     ondansetron (ZOFRAN) 4 MG tablet Take 1 tablet (4 mg total) by mouth every 8 (eight) hours as needed for nausea or vomiting. 20 tablet 0   podofilox (CONDYLOX) 0.5 % external solution Apply topically every 12 hours in the morning and evening for 3 days, then withhold for 4 days; repeat cycle up to 4 times 3.5 mL 0   predniSONE (STERAPRED UNI-PAK 21 TAB) 10 MG (21) TBPK tablet Take 6ills first day , then 5 pills day 2 and then cut down one pill day until gone 21 tablet 0   No facility-administered medications prior to visit.    No Known Allergies  Review of Systems  Constitutional:  Positive for fatigue. Negative for appetite change and fever.  HENT:  Positive for congestion, ear pain, postnasal drip,  rhinorrhea, sinus pressure, sinus pain and sore throat.   Eyes:  Positive for pain (bilateral).  Respiratory:  Negative for cough, chest tightness, shortness of breath and wheezing.   Cardiovascular:  Negative for chest pain and palpitations.  Gastrointestinal:  Negative for abdominal pain, constipation, diarrhea, nausea and vomiting.  Genitourinary:  Negative for dysuria and hematuria.  Musculoskeletal:  Negative for arthralgias, back pain, joint swelling and myalgias.  Skin:  Negative for rash.  Allergic/Immunologic: Positive for environmental allergies.  Neurological:  Positive for headaches. Negative for dizziness and weakness.  Psychiatric/Behavioral:  Negative for dysphoric mood. The patient is not  nervous/anxious.        Objective:    Physical Exam Vitals reviewed.  Constitutional:      Appearance: She is ill-appearing.  HENT:     Right Ear: Tympanic membrane normal.     Left Ear: Tympanic membrane is erythematous.     Nose:     Right Sinus: Maxillary sinus tenderness and frontal sinus tenderness present.     Left Sinus: Maxillary sinus tenderness and frontal sinus tenderness present.     Mouth/Throat:     Pharynx: Posterior oropharyngeal erythema present.  Cardiovascular:     Rate and Rhythm: Normal rate and regular rhythm.     Pulses: Normal pulses.     Heart sounds: Normal heart sounds.  Pulmonary:     Effort: Pulmonary effort is normal.     Breath sounds: Normal breath sounds.  Abdominal:     General: Abdomen is flat. Bowel sounds are normal.     Palpations: Abdomen is soft.  Neurological:     Mental Status: She is alert and oriented to person, place, and time.  Psychiatric:        Mood and Affect: Mood normal.        Behavior: Behavior normal.     BP 108/68 (BP Location: Left Arm, Patient Position: Sitting)   Pulse 68   Temp 97.9 F (36.6 C) (Temporal)   Ht '5\' 7"'  (1.702 m)   Wt 139 lb (63 kg)   SpO2 98%   BMI 21.77 kg/m  Wt Readings from Last 3 Encounters:  04/13/22 139 lb (63 kg)  05/11/21 146 lb 9.6 oz (66.5 kg)  12/20/20 142 lb (64.4 kg)    Health Maintenance Due  Topic Date Due   HIV Screening  Never done   Hepatitis C Screening  Never done   TETANUS/TDAP  Never done   PAP SMEAR-Modifier  10/15/2021   INFLUENZA VACCINE  Never done    Lab Results  Component Value Date   WBC 8.9 11/10/2020   HGB 12.6 11/10/2020   HCT 37.6 11/10/2020   MCV 94 11/10/2020   PLT 303 11/10/2020   Lab Results  Component Value Date   NA 136 11/10/2020   K 4.7 11/10/2020   CO2 24 11/10/2020   GLUCOSE 82 11/10/2020   BUN 11 11/10/2020   CREATININE 0.68 11/10/2020   BILITOT 0.6 11/10/2020   ALKPHOS 57 11/10/2020   AST 19 11/10/2020   ALT 14 11/10/2020    PROT 7.0 11/10/2020   ALBUMIN 4.8 11/10/2020   CALCIUM 9.5 11/10/2020   EGFR 115 11/10/2020   Lab Results  Component Value Date   CHOL 153 11/10/2020   Lab Results  Component Value Date   HDL 104 11/10/2020   Lab Results  Component Value Date   LDLCALC 39 11/10/2020   Lab Results  Component Value Date   TRIG 43 11/10/2020  Lab Results  Component Value Date   CHOLHDL 1.5 11/10/2020          Assessment & Plan:   1. Acute non-recurrent sinusitis of other sinus - azithromycin (ZITHROMAX) 250 MG tablet; Take 2 tablets on day 1, then 1 tablet daily on days 2 through 5  Dispense: 6 tablet; Refill: 0  2. Seasonal allergic rhinitis, unspecified trigger - fluticasone (FLONASE) 50 MCG/ACT nasal spray; Place 2 sprays into both nostrils daily.  Dispense: 16 g; Refill: 6   Take Z-pack as prescribed Rest and push fluids Continue Zyrtec as directed Begin Flonase nasal spray Take Tylenol or Ibuprofen as needed for headache  I,Lauren M Auman,acting as a scribe for [employer], NP.,have documented all relevant documentation on the behalf of Rip Harbour, NP,as directed by  Rip Harbour, NP while in the presence of Rip Harbour, NP.   Oleta Mouse, CMA  I, Rip Harbour, NP, have reviewed all documentation for this visit. The documentation on 04/13/22 for the exam, diagnosis, procedures, and orders are all accurate and complete.   Signed, Jerrell Belfast, DNP 04/13/22 at 9:49 am

## 2022-04-13 NOTE — Patient Instructions (Signed)
Take Z-pack as prescribed Rest and push fluids Continue Zyrtec as directed Begin Flonase nasal spray Take Tylenol or Ibuprofen as needed for headache    Sinus Infection, Adult A sinus infection is soreness and swelling (inflammation) of your sinuses. Sinuses are hollow spaces in the bones around your face. They are located: Around your eyes. In the middle of your forehead. Behind your nose. In your cheekbones. Your sinuses and nasal passages are lined with a fluid called mucus. Mucus drains out of your sinuses. Swelling can trap mucus in your sinuses. This lets germs (bacteria, virus, or fungus) grow, which leads to infection. Most of the time, this condition is caused by a virus. What are the causes? Allergies. Asthma. Germs. Things that block your nose or sinuses. Growths in the nose (nasal polyps). Chemicals or irritants in the air. A fungus. This is rare. What increases the risk? Having a weak body defense system (immune system). Doing a lot of swimming or diving. Using nasal sprays too much. Smoking. What are the signs or symptoms? The main symptoms of this condition are pain and a feeling of pressure around the sinuses. Other symptoms include: Stuffy nose (congestion). This may make it hard to breathe through your nose. Runny nose (drainage). Soreness, swelling, and warmth in the sinuses. A cough that may get worse at night. Being unable to smell and taste. Mucus that collects in the throat or the back of the nose (postnasal drip). This may cause a sore throat or bad breath. Being very tired (fatigued). A fever. How is this diagnosed? Your symptoms. Your medical history. A physical exam. Tests to find out if your condition is short-term (acute) or long-term (chronic). Your doctor may: Check your nose for growths (polyps). Check your sinuses using a tool that has a light on one end (endoscope). Check for allergies or germs. Do imaging tests, such as an MRI or CT  scan. How is this treated? Treatment for this condition depends on the cause and whether it is short-term or long-term. If caused by a virus, your symptoms should go away on their own within 10 days. You may be given medicines to relieve symptoms. They include: Medicines that shrink swollen tissue in the nose. A spray that treats swelling of the nostrils. Rinses that help get rid of thick mucus in your nose (nasal saline washes). Medicines that treat allergies (antihistamines). Over-the-counter pain relievers. If caused by bacteria, your doctor may wait to see if you will get better without treatment. You may be given antibiotic medicine if you have: A very bad infection. A weak body defense system. If caused by growths in the nose, surgery may be needed. Follow these instructions at home: Medicines Take, use, or apply over-the-counter and prescription medicines only as told by your doctor. These may include nasal sprays. If you were prescribed an antibiotic medicine, take it as told by your doctor. Do not stop taking it even if you start to feel better. Hydrate and humidify  Drink enough water to keep your pee (urine) pale yellow. Use a cool mist humidifier to keep the humidity level in your home above 50%. Breathe in steam for 10-15 minutes, 3-4 times a day, or as told by your doctor. You can do this in the bathroom while a hot shower is running. Try not to spend time in cool or dry air. Rest Rest as much as you can. Sleep with your head raised (elevated). Make sure you get enough sleep each night. General instructions  Put a warm, moist washcloth on your face 3-4 times a day, or as often as told by your doctor. Use nasal saline washes as often as told by your doctor. Wash your hands often with soap and water. If you cannot use soap and water, use hand sanitizer. Do not smoke. Avoid being around people who are smoking (secondhand smoke). Keep all follow-up visits. Contact a doctor  if: You have a fever. Your symptoms get worse. Your symptoms do not get better within 10 days. Get help right away if: You have a very bad headache. You cannot stop vomiting. You have very bad pain or swelling around your face or eyes. You have trouble seeing. You feel confused. Your neck is stiff. You have trouble breathing. These symptoms may be an emergency. Get help right away. Call 911. Do not wait to see if the symptoms will go away. Do not drive yourself to the hospital. Summary A sinus infection is swelling of your sinuses. Sinuses are hollow spaces in the bones around your face. This condition is caused by tissues in your nose that become inflamed or swollen. This traps germs. These can lead to infection. If you were prescribed an antibiotic medicine, take it as told by your doctor. Do not stop taking it even if you start to feel better. Keep all follow-up visits. This information is not intended to replace advice given to you by your health care provider. Make sure you discuss any questions you have with your health care provider. Document Revised: 06/28/2021 Document Reviewed: 06/28/2021 Elsevier Patient Education  [place].

## 2022-06-06 ENCOUNTER — Ambulatory Visit (INDEPENDENT_AMBULATORY_CARE_PROVIDER_SITE_OTHER): Payer: Commercial Managed Care - PPO | Admitting: Nurse Practitioner

## 2022-06-06 ENCOUNTER — Encounter: Payer: Self-pay | Admitting: Nurse Practitioner

## 2022-06-06 VITALS — BP 110/68 | HR 67 | Temp 97.1°F | Ht 67.0 in | Wt 143.0 lb

## 2022-06-06 DIAGNOSIS — J309 Allergic rhinitis, unspecified: Secondary | ICD-10-CM | POA: Diagnosis not present

## 2022-06-06 DIAGNOSIS — Z7251 High risk heterosexual behavior: Secondary | ICD-10-CM | POA: Diagnosis not present

## 2022-06-06 DIAGNOSIS — Z0000 Encounter for general adult medical examination without abnormal findings: Secondary | ICD-10-CM

## 2022-06-06 MED ORDER — LEVOCETIRIZINE DIHYDROCHLORIDE 5 MG PO TABS: 5.0000 mg | ORAL_TABLET | Freq: Every evening | ORAL | 3 refills | Status: DC

## 2022-06-06 MED ORDER — AZELASTINE HCL 0.1 % NA SOLN: 1.0000 | Freq: Two times a day (BID) | NASAL | 12 refills | Status: DC

## 2022-06-06 NOTE — Progress Notes (Signed)
;  Subjective:  Patient ID: Emily Holt, female    DOB: 1981/01/02  Age: 41 y.o. MRN: [id number]  Chief Complaint  Patient presents with   Annual Exam    HPI Encounter for general adult medical examination without abnormal findings  Physical ("At Risk" items are starred): Patient's last physical exam was 1 year ago . She has chronic allergic rhinitis. 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 Aime is an avid runner; reports chronic bilateral ankle pain after running on treadmill. She has requested STI testing.      SDOH Screenings   Food Insecurity: No Food Insecurity (06/06/2022)  Housing: Low Risk  (06/06/2022)  Transportation Needs: No Transportation Needs (06/06/2022)  Utilities: Not At Risk (06/06/2022)  Alcohol Screen: Low Risk  (06/06/2022)  Depression (PHQ2-9): Low Risk  (06/06/2022)  Financial Resource Strain: Low Risk  (06/06/2022)  Physical Activity: Sufficiently Active (06/06/2022)  Social Connections: Socially Isolated (06/06/2022)  Stress: No Stress Concern Present (06/06/2022)  Tobacco Use: Low Risk  (06/06/2022)       06/06/2022    1:58 PM 11/10/2020    2:25 PM  Fall Risk   Falls in the past year? 0 0  Number falls in past yr: 0 0  Injury with Fall? 0 0  Risk for fall due to : No Fall Risks   Follow up Falls evaluation completed Falls evaluation completed       06/06/2022    1:58 PM 05/11/2021    8:59 AM 02/26/2020    3:46 PM  Depression screen PHQ 2/9  Decreased Interest 0 0 0  Down, Depressed, Hopeless 0 0 0  PHQ - 2 Score 0 0 0  Altered sleeping 0    Tired, decreased energy 0    Change in appetite 0    Feeling bad or failure about yourself  0    Trouble concentrating 0    Moving slowly or fidgety/restless 0    Suicidal thoughts 0    PHQ-9 Score 0    Difficult doing work/chores Not difficult at all      Functional Status Survey: Is the patient deaf or have difficulty hearing?: No Does the patient have  difficulty seeing, even when wearing glasses/contacts?: No Does the patient have difficulty concentrating, remembering, or making decisions?: No Does the patient have difficulty walking or climbing stairs?: No Does the patient have difficulty dressing or bathing?: No Does the patient have difficulty doing errands alone such as visiting a doctor's office or shopping?: No   Safety: reviewed ;  Patient wears a seat belt. Patient's home has smoke detectors and carbon monoxide detectors. Patient practices appropriate gun safety Patient wears sunscreen with extended sun exposure. Dental Care: Does not have biannual cleanings, brushes and does not floss daily. Ophthalmology/Optometry: Annual visit.  Hearing loss: none Vision impairments: Contacts Patient is not afflicted from Stress Incontinence and Urge Incontinence   Current Outpatient Medications on File Prior to Visit  Medication Sig Dispense Refill   EPINEPHrine (ADRENALIN) 1 MG/ML SOLN INJECT 1 MG/ML INTRAMUSCULAR FOR ANAPHYLAXIS AS DIRECTED. 1 mL 3   Etonogestrel (NEXPLANON [place]) Inject into the skin.     fluticasone (FLONASE) 50 MCG/ACT nasal spray Place 2 sprays into both nostrils daily. 16 g 6   ibuprofen (ADVIL) 800 MG tablet Take 1 tablet (800 mg total) by mouth every 8 (eight) hours as needed. 30 tablet 0   nitrofurantoin, macrocrystal-monohydrate, (MACROBID) 100 MG capsule Take 100 mg by mouth 2 (two) times daily.  ondansetron (ZOFRAN) 4 MG tablet Take 1 tablet (4 mg total) by mouth every 8 (eight) hours as needed for nausea or vomiting. 20 tablet 0   podofilox (CONDYLOX) 0.5 % external solution Apply topically every 12 hours in the morning and evening for 3 days, then withhold for 4 days; repeat cycle up to 4 times 3.5 mL 0   No current facility-administered medications on file prior to visit.    Social Hx   Social History   Socioeconomic History   Marital status: Single    Spouse name: Not on file   Number of children: 0    Years of education: Not on file   Highest education level: Not on file  Occupational History   Not on file  Tobacco Use   Smoking status: Never   Smokeless tobacco: Never  Vaping Use   Vaping Use: Never used  Substance and Sexual Activity   Alcohol use: Yes    Alcohol/week: 2.0 standard drinks of alcohol    Types: 2 Cans of beer per week    Comment: week   Drug use: Never   Sexual activity: Yes    Partners: Male    Birth control/protection: I.U.D.  Other Topics Concern   Not on file  Social History Narrative   Not on file   Social Determinants of Health   Financial Resource Strain: Low Risk  (06/06/2022)   Overall Financial Resource Strain (CARDIA)    Difficulty of Paying Living Expenses: Not hard at all  Food Insecurity: No Food Insecurity (06/06/2022)   Hunger Vital Sign    Worried About Running Out of Food in the Last Year: Never true    Ran Out of Food in the Last Year: Never true  Transportation Needs: No Transportation Needs (06/06/2022)   PRAPARE - Administrator, Civil Service (Medical): No    Lack of Transportation (Non-Medical): No  Physical Activity: Sufficiently Active (06/06/2022)   Exercise Vital Sign    Days of Exercise per Week: 5 days    Minutes of Exercise per Session: 60 min  Stress: No Stress Concern Present (06/06/2022)   [employer] of Occupational Health - Occupational Stress Questionnaire    Feeling of Stress : Not at all  Social Connections: Socially Isolated (06/06/2022)   Social Connection and Isolation Panel [NHANES]    Frequency of Communication with Friends and Family: More than three times a week    Frequency of Social Gatherings with Friends and Family: More than three times a week    Attends Religious Services: Never    Database administrator or Organizations: No    Attends Engineer, structural: Never    Marital Status: Never married   Past Medical History:  Diagnosis Date   Cystitis    Dermatitis due  to plant    Family History  Problem Relation Age of Onset   Hypertension Mother    Diabetes Maternal Grandmother    Diabetes Maternal Grandfather    Colon cancer Maternal Grandfather     Review of Systems  Constitutional:  Negative for chills, fatigue and fever.  HENT:  Negative for congestion, ear pain, rhinorrhea and sore throat.   Respiratory:  Negative for cough and shortness of breath.   Cardiovascular:  Negative for chest pain.  Gastrointestinal:  Negative for abdominal pain, constipation, diarrhea, nausea and vomiting.  Genitourinary:  Negative for dysuria and urgency.  Musculoskeletal:  Positive for arthralgias (chronic-bilateral ankles). Negative for back pain and myalgias.  Allergic/Immunologic: Positive for environmental allergies.  Neurological:  Negative for dizziness, weakness, light-headedness and headaches.  Psychiatric/Behavioral:  Negative for dysphoric mood. The patient is not nervous/anxious.      Objective:  BP 110/68   Pulse 67   Temp (!) 97.1 F (36.2 C)   Ht 5\' 7"  (1.702 m)   Wt 143 lb (64.9 kg)   LMP 05/31/2022   SpO2 98%   BMI 22.40 kg/m       06/06/2022    1:57 PM 04/13/2022    9:05 AM 05/11/2021    8:58 AM  BP/Weight  Systolic BP  108 07/11/2021  Diastolic BP  68 70  Wt. (Lbs) 143 139 146.6  BMI 22.4 kg/m2 21.77 kg/m2 22.96 kg/m2    Physical Exam Vitals reviewed.  Constitutional:      Appearance: Normal appearance.  HENT:     Head: Normocephalic.     Right Ear: Tympanic membrane normal.     Left Ear: Tympanic membrane normal.     Nose: Nose normal.     Mouth/Throat:     Mouth: Mucous membranes are moist.  Eyes:     Pupils: Pupils are equal, round, and reactive to light.  Cardiovascular:     Rate and Rhythm: Normal rate and regular rhythm.  Pulmonary:     Effort: Pulmonary effort is normal.     Breath sounds: Normal breath sounds.  Abdominal:     General: Bowel sounds are normal.     Palpations: Abdomen is soft.  Musculoskeletal:         General: Normal range of motion.     Cervical back: Neck supple.  Skin:    General: Skin is warm and dry.     Capillary Refill: Capillary refill takes less than 2 seconds.  Neurological:     General: No focal deficit present.     Mental Status: She is alert and oriented to person, place, and time.  Psychiatric:        Mood and Affect: Mood normal.        Behavior: Behavior normal.     Lab Results  Component Value Date   WBC 8.9 11/10/2020   HGB 12.6 11/10/2020   HCT 37.6 11/10/2020   PLT 303 11/10/2020   GLUCOSE 82 11/10/2020   CHOL 153 11/10/2020   TRIG 43 11/10/2020   HDL 104 11/10/2020   LDLCALC 39 11/10/2020   ALT 14 11/10/2020   AST 19 11/10/2020   NA 136 11/10/2020   K 4.7 11/10/2020   CL 97 11/10/2020   CREATININE 0.68 11/10/2020   BUN 11 11/10/2020   CO2 24 11/10/2020      Assessment & Plan:  1. Annual physical exam - T4, free - TSH - CBC with Differential/Platelet - Comprehensive metabolic panel - Lipid Panel  2. Chronic allergic rhinitis - azelastine (ASTELIN) 0.1 % nasal spray; Place 1 spray into both nostrils 2 (two) times daily. Use in each nostril as directed  Dispense: 30 mL; Refill: 12 - levocetirizine (XYZAL) 5 MG tablet; Take 1 tablet (5 mg total) by mouth every evening.  Dispense: 90 tablet; Refill: 3 - Ambulatory referral to Allergy  3. History of unprotected sex - HSV(herpes simplex vrs) 1+2 ab-IgG - STI Profile       These are the goals we discussed:  Goals   Improve chronic allergic rhinitis      This is a list of the screening recommended for you and due dates:  Health Maintenance  Topic  Date Due   Tetanus Vaccine  Never done   Pap Smear  10/15/2021   Flu Shot  11/05/2022*   HPV Vaccine  Aged Out  *Topic was postponed. The date shown is not the original due date.      AN INDIVIDUALIZED CARE PLAN: was established or reinforced today.   SELF MANAGEMENT: The patient and I together assessed ways to personally work  towards obtaining the recommended goals  Support needs The patient and/or family needs were assessed and services were offered and not necessary at this time.    We will call you with lab results and referral to allergist Take Xyzal 5 mg daily for allergies Use Astelin nasal spray daily Follow-up in 1 year for physical exam    Follow-up: 1-year  Signed, Jerrell Belfast, [place] [phone]

## 2022-06-06 NOTE — Patient Instructions (Addendum)
We will call you with lab results and referral to allergist Take Xyzal 5 mg daily for allergies Use Astelin nasal spray daily Follow-up in 1 year for physical exam   Preventive Care 94-39 Years Old, Female Preventive care refers to lifestyle choices and visits with your health care provider that can promote health and wellness. Preventive care visits are also called wellness exams. What can I expect for my preventive care visit? Counseling During your preventive care visit, your health care provider may ask about your: Medical history, including: Past medical problems. Family medical history. Pregnancy history. Current health, including: Menstrual cycle. Method of birth control. Emotional well-being. Home life and relationship well-being. Sexual activity and sexual health. Lifestyle, including: Alcohol, nicotine or tobacco, and drug use. Access to firearms. Diet, exercise, and sleep habits. Work and work Statistician. Sunscreen use. Safety issues such as seatbelt and bike helmet use. Physical exam Your health care provider may check your: Height and weight. These may be used to calculate your BMI (body mass index). BMI is a measurement that tells if you are at a healthy weight. Waist circumference. This measures the distance around your waistline. This measurement also tells if you are at a healthy weight and may help predict your risk of certain diseases, such as type 2 diabetes and high blood pressure. Heart rate and blood pressure. Body temperature. Skin for abnormal spots. What immunizations do I need?  Vaccines are usually given at various ages, according to a schedule. Your health care provider will recommend vaccines for you based on your age, medical history, and lifestyle or other factors, such as travel or where you work. What tests do I need? Screening Your health care provider may recommend screening tests for certain conditions. This may include: Pelvic exam and  Pap test. Lipid and cholesterol levels. Diabetes screening. This is done by checking your blood sugar (glucose) after you have not eaten for a while (fasting). Hepatitis B test. Hepatitis C test. HIV (human immunodeficiency virus) test. STI (sexually transmitted infection) testing, if you are at risk. BRCA-related cancer screening. This may be done if you have a family history of breast, ovarian, tubal, or peritoneal cancers. Talk with your health care provider about your test results, treatment options, and if necessary, the need for more tests. Follow these instructions at home: Eating and drinking  Eat a healthy diet that includes fresh fruits and vegetables, whole grains, lean protein, and low-fat dairy products. Take vitamin and mineral supplements as recommended by your health care provider. Do not drink alcohol if: Your health care provider tells you not to drink. You are pregnant, may be pregnant, or are planning to become pregnant. If you drink alcohol: Limit how much you have to 0-1 drink a day. Know how much alcohol is in your drink. In the [place] one drink equals one 12 oz bottle of beer (355 mL), one 5 oz glass of wine (148 mL), or one 1 oz glass of hard liquor (44 mL). Lifestyle Brush your teeth every morning and night with fluoride toothpaste. Floss one time each day. Exercise for at least 30 minutes 5 or more days each week. Do not use any products that contain nicotine or tobacco. These products include cigarettes, chewing tobacco, and vaping devices, such as e-cigarettes. If you need help quitting, ask your health care provider. Do not use drugs. If you are sexually active, practice safe sex. Use a condom or other form of protection to prevent STIs. If you do not wish to  become pregnant, use a form of birth control. If you plan to become pregnant, see your health care provider for a prepregnancy visit. Find healthy ways to manage stress, such as: Meditation, yoga, or  listening to music. Journaling. Talking to a trusted person. Spending time with friends and family. Minimize exposure to UV radiation to reduce your risk of skin cancer. Safety Always wear your seat belt while driving or riding in a vehicle. Do not drive: If you have been drinking alcohol. Do not ride with someone who has been drinking. If you have been using any mind-altering substances or drugs. While texting. When you are tired or distracted. Wear a helmet and other protective equipment during sports activities. If you have firearms in your house, make sure you follow all gun safety procedures. Seek help if you have been physically or sexually abused. What's next? Go to your health care provider once a year for an annual wellness visit. Ask your health care provider how often you should have your eyes and teeth checked. Stay up to date on all vaccines. This information is not intended to replace advice given to you by your health care provider. Make sure you discuss any questions you have with your health care provider. Document Revised: 01/19/2021 Document Reviewed: 01/19/2021 Elsevier Patient Education  [place].

## 2022-06-07 LAB — STI PROFILE
HCV Ab: NONREACTIVE
HIV Screen 4th Generation wRfx: NONREACTIVE
Hep B Core Total Ab: NEGATIVE
Hep B Surface Ab, Qual: REACTIVE
Hepatitis B Surface Ag: NEGATIVE
RPR Ser Ql: NONREACTIVE

## 2022-06-07 LAB — CBC WITH DIFFERENTIAL/PLATELET
Basophils Absolute: 0.1 10*3/uL (ref 0.0–0.2)
Basos: 1 %
EOS (ABSOLUTE): 0.1 10*3/uL (ref 0.0–0.4)
Eos: 1 %
Hematocrit: 36.2 % (ref 34.0–46.6)
Hemoglobin: 12.1 g/dL (ref 11.1–15.9)
Immature Grans (Abs): 0 10*3/uL (ref 0.0–0.1)
Immature Granulocytes: 0 %
Lymphocytes Absolute: 2.7 10*3/uL (ref 0.7–3.1)
Lymphs: 38 %
MCH: 30.3 pg (ref 26.6–33.0)
MCHC: 33.4 g/dL (ref 31.5–35.7)
MCV: 91 fL (ref 79–97)
Monocytes Absolute: 0.4 10*3/uL (ref 0.1–0.9)
Monocytes: 5 %
Neutrophils Absolute: 3.8 10*3/uL (ref 1.4–7.0)
Neutrophils: 55 %
Platelets: 307 10*3/uL (ref 150–450)
RBC: 3.99 x10E6/uL (ref 3.77–5.28)
RDW: 12.5 % (ref 11.7–15.4)
WBC: 7.1 10*3/uL (ref 3.4–10.8)

## 2022-06-07 LAB — COMPREHENSIVE METABOLIC PANEL
ALT: 15 IU/L (ref 0–32)
AST: 18 IU/L (ref 0–40)
Albumin/Globulin Ratio: 1.9 (ref 1.2–2.2)
Albumin: 4.7 g/dL (ref 3.9–4.9)
Alkaline Phosphatase: 58 IU/L (ref 44–121)
BUN/Creatinine Ratio: 21 (ref 9–23)
BUN: 14 mg/dL (ref 6–20)
Bilirubin Total: 0.2 mg/dL (ref 0.0–1.2)
CO2: 24 mmol/L (ref 20–29)
Calcium: 9.6 mg/dL (ref 8.7–10.2)
Chloride: 101 mmol/L (ref 96–106)
Creatinine, Ser: 0.67 mg/dL (ref 0.57–1.00)
Globulin, Total: 2.5 g/dL (ref 1.5–4.5)
Glucose: 96 mg/dL (ref 70–99)
Potassium: 5.2 mmol/L (ref 3.5–5.2)
Sodium: 138 mmol/L (ref 134–144)
Total Protein: 7.2 g/dL (ref 6.0–8.5)
eGFR: 114 mL/min/{1.73_m2} (ref >59)

## 2022-06-07 LAB — LIPID PANEL
Chol/HDL Ratio: 1.6 ratio (ref 0.0–4.4)
Cholesterol, Total: 152 mg/dL (ref 100–199)
HDL: 98 mg/dL (ref >39)
LDL Chol Calc (NIH): 38 mg/dL (ref 0–99)
Triglycerides: 89 mg/dL (ref 0–149)
VLDL Cholesterol Cal: 16 mg/dL (ref 5–40)

## 2022-06-07 LAB — HSV 1 AND 2 AB, IGG
HSV 1 Glycoprotein G Ab, IgG: >62.2 index — ABNORMAL HIGH (ref 0.00–0.90)
HSV 2 IgG, Type Spec: <0.91 index (ref 0.00–0.90)

## 2022-06-07 LAB — TSH: TSH: 1.93 u[IU]/mL (ref 0.450–4.500)

## 2022-06-07 LAB — T4, FREE: Free T4: 1.07 ng/dL (ref 0.82–1.77)

## 2022-06-07 LAB — CARDIOVASCULAR RISK ASSESSMENT

## 2022-06-07 LAB — HCV INTERPRETATION

## 2022-06-22 ENCOUNTER — Other Ambulatory Visit: Payer: Self-pay | Admitting: Nurse Practitioner

## 2022-06-22 ENCOUNTER — Telehealth: Payer: Self-pay

## 2022-06-22 DIAGNOSIS — A630 Anogenital (venereal) warts: Secondary | ICD-10-CM

## 2022-06-22 MED ORDER — PODOFILOX 0.5 % EX SOLN: CUTANEOUS | 0 refills | Status: DC

## 2022-06-22 NOTE — Telephone Encounter (Signed)
Patient called and stated that she dropped her Podofilox on the floor and it spilled.  She was wondering if she could get another refill?

## 2022-08-15 ENCOUNTER — Encounter: Payer: Self-pay | Admitting: Allergy

## 2022-08-15 ENCOUNTER — Ambulatory Visit: Payer: Commercial Managed Care - PPO | Admitting: Allergy

## 2022-08-15 VITALS — BP 108/62 | HR 63 | Resp 16 | Ht 67.0 in | Wt 131.4 lb

## 2022-08-15 DIAGNOSIS — T63481A Toxic effect of venom of other arthropod, accidental (unintentional), initial encounter: Secondary | ICD-10-CM | POA: Diagnosis not present

## 2022-08-15 DIAGNOSIS — J310 Chronic rhinitis: Secondary | ICD-10-CM

## 2022-08-15 DIAGNOSIS — R239 Unspecified skin changes: Secondary | ICD-10-CM

## 2022-08-15 MED ORDER — RYALTRIS 665-25 MCG/ACT NA SUSP: NASAL | 5 refills | Status: DC

## 2022-08-15 NOTE — Patient Instructions (Addendum)
-   return Thursday 08/24/22 at 8:30 for skin testing to environmental allergens and fire ant. - recommend taking antihistamine like Ryvent (if covered by insurance) or OTC antihistamines like Xyzal or Allegra bought in bulk from place like [facility] or [place] for better cost.     Antihistamine can be take 1-2 times daily if needed for allergy symptom control - continue nasal saline rinse daily during increased allergy symptoms.  Use distilled water or boil water and bring down to room temperature before use.  Breathe through your mouth entire time. - use Ryaltris nasal spray 2 sprays each nostril 1-2 times a day as needed for nasal congestion/drainage.  This is a combination nasal spray of mometasone (steroid) for congestion control and olopatadine (antihistamine) for drainage control. - if allergy testing is positive you would be eligible for allergen immunotherapy.  Allergy shots "re-train" and "reset" the immune system to ignore environmental allergens and decrease the resulting immune response to those allergens (sneezing, itchy watery eyes, runny nose, nasal congestion, etc).   Allergy shots improve symptoms in 80-85% of patients.   Will provide information handout on this if testing is positive.    - continue avoidance of fire ant/stinging insects - have access to self-injectable epinephrine (Epipen) 0.3mg  at all times - follow emergency action plan in case of allergic reaction    Follow-up 08/24/22.  Hold all antihistamines for 3 days prior to this appointment

## 2022-08-15 NOTE — Progress Notes (Signed)
New Patient Note  RE: Emily Holt MRN: [id number] DOB: 02-25-1981 Date of Office Visit: 08/15/2022  Primary care provider: Rip Harbour, NP  Chief Complaint: allergies  History of present illness: Emily Holt is a 42 y.o. female presenting today for evaluation of allergic rhinitis.   She has lived in this area for past 9 years and states her allergies have been worse here than when she lived in [place].  She states she has allergy symptoms in March and September mostly. She reports ear pressure, sinus pressure, congestion primarily.  She reports 2-3 times a year treatment with antibiotic and/or prednisone that may take a day or two to work.   She also may take an allergy medication for several days and does get some relief.  She started doing nasal saline rinse last fall for first time.   She has been prescribed astelin and flonase but does not really use these.  She will take an antihistamine but does not want to have to take something on daily basis and wants to know what she is allergic to so she can treat symptoms better.  She would also like something more affordable and hopefully insurance covered.   She reports a stinging insect allergy to fire ants.  She states in 2013 she had a couple fire ant bites on her arm and foot and she states she took a shower in like 5 minutes after the bites.  She states she broke out in hives all over and she went to [facility] and they transported her to ED because the hives were all over.  She states she has had signficant swelling with flying stinging insects but it doesn't cross over joints.  She states she has found that white toothpaste applied helps with the swelling.  Denies respiratory, GI or CV related symptoms. She has had epipen in the past but does not have a current one.     No history of asthma or eczema or food allergy.    Review of systems in the past 4 weeks: Review of Systems  Constitutional: Negative.   HENT: Negative.     Eyes: Negative.   Respiratory: Negative.    Cardiovascular: Negative.   Gastrointestinal: Negative.   Musculoskeletal: Negative.   Skin: Negative.   Allergic/Immunologic: Negative.   Neurological: Negative.     All other systems negative unless noted above in HPI  Past medical history: Past Medical History:  Diagnosis Date   Cystitis    Dermatitis due to plant     Past surgical history: Past Surgical History:  Procedure Laterality Date   TONSILLECTOMY  11/18/2003    Family history:  Family History  Problem Relation Age of Onset   Hypertension Mother    Diabetes Maternal Grandmother    Diabetes Maternal Grandfather    Colon cancer Maternal Grandfather     Social history: Lives in a home with carpeting in bedroom with heat pump heating and cooling.  Cat in the home.  No concern for water damage, mildew or roaches in the home but states saw some mold in the crawlspace.  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  Denies smoking history.    Medication List: No current outpatient medications on file.   No current facility-administered medications for this visit.    Known medication allergies: No Known Allergies   Physical examination: Blood pressure 108/62, pulse 63, resp. rate 16, height 5\' 7"  (1.702 m), weight 131 lb 6.4 oz (59.6 kg), SpO2 98 %.  General: Alert, interactive, in no acute distress. HEENT: PERRLA, TMs pearly gray, turbinates non-edematous without discharge, post-pharynx non erythematous. Neck: Supple without lymphadenopathy. Lungs: Clear to auscultation without wheezing, rhonchi or rales. {no increased work of breathing. CV: Normal S1, S2 without murmurs. Abdomen: Nondistended, nontender. Skin: Warm and dry, without lesions or rashes. Extremities:  No clubbing, cyanosis or edema. Neuro:   Grossly intact.  Diagnositics/Labs: None today due to insurance   Assessment and plan: Rhinitis, presumed allergic - return Thursday 08/24/22 at 8:30 for  skin testing to environmental allergens and fire ant. - recommend taking antihistamine like Ryvent (if covered by insurance) or OTC antihistamines like Xyzal or Allegra bought in bulk from place like [facility] or [employer] for better cost.     Antihistamine can be take 1-2 times daily if needed for allergy symptom control - continue nasal saline rinse daily during increased allergy symptoms.  Use distilled water or boil water and bring down to room temperature before use.  Breathe through your mouth entire time. - use Ryaltris nasal spray 2 sprays each nostril 1-2 times a day as needed for nasal congestion/drainage.  This is a combination nasal spray of mometasone (steroid) for congestion control and olopatadine (antihistamine) for drainage control. - if allergy testing is positive you would be eligible for allergen immunotherapy.  Allergy shots "re-train" and "reset" the immune system to ignore environmental allergens and decrease the resulting immune response to those allergens (sneezing, itchy watery eyes, runny nose, nasal congestion, etc).   Allergy shots improve symptoms in 80-85% of patients.   Will provide information handout on this if testing is positive.    Stinging insect hypersensitivity - continue avoidance of fire ant/stinging insects - have access to self-injectable epinephrine (Epipen) 0.3mg  at all times - follow emergency action plan in case of allergic reaction    Follow-up 08/24/22.  Hold all antihistamines for 3 days prior to this appointment   I appreciate the opportunity to take part in Malynda's care. Please do not hesitate to contact me with questions.  Sincerely,   Margo Aye, MD Allergy/Immunology [facility]

## 2022-08-24 ENCOUNTER — Encounter: Payer: Self-pay | Admitting: Allergy and Immunology

## 2022-08-24 ENCOUNTER — Ambulatory Visit (INDEPENDENT_AMBULATORY_CARE_PROVIDER_SITE_OTHER): Payer: Commercial Managed Care - PPO | Admitting: Allergy and Immunology

## 2022-08-24 VITALS — BP 112/60 | HR 90 | Resp 16

## 2022-08-24 DIAGNOSIS — J3089 Other allergic rhinitis: Secondary | ICD-10-CM

## 2022-08-24 DIAGNOSIS — T63481A Toxic effect of venom of other arthropod, accidental (unintentional), initial encounter: Secondary | ICD-10-CM

## 2022-08-24 MED ORDER — EPINEPHRINE 0.3 MG/0.3ML IJ SOAJ: 0.3000 mg | INTRAMUSCULAR | 1 refills | Status: AC | PRN

## 2022-08-28 ENCOUNTER — Encounter: Payer: Self-pay | Admitting: Allergy and Immunology

## 2022-08-28 NOTE — Progress Notes (Signed)
Emily Holt presents to this clinic to have skin testing performed.  Allergy skin testing did not identify any hypersensitivity against a screening panel of aeroallergens.  She did demonstrate hypersensitivity against fire ant.  It was recommended that she start a course of immunotherapy against fire ant.  She will follow-up with Dr. Nelva Bush regarding further management of her medical issues.

## 2023-02-08 IMAGING — MG MM DIGITAL SCREENING BILAT W/ TOMO AND CAD
6 of 10 series · 6 of 30 positions shown · non-contrast
Comparison: None.

CLINICAL DATA: Screening.

EXAM:
DIGITAL SCREENING BILATERAL MAMMOGRAM WITH TOMOSYNTHESIS AND CAD
TECHNIQUE: Bilateral screening digital craniocaudal and mediolateral oblique
mammograms were obtained. Bilateral screening digital breast
tomosynthesis was performed. The images were evaluated with
computer-aided detection.

[R MLO synth-2D]
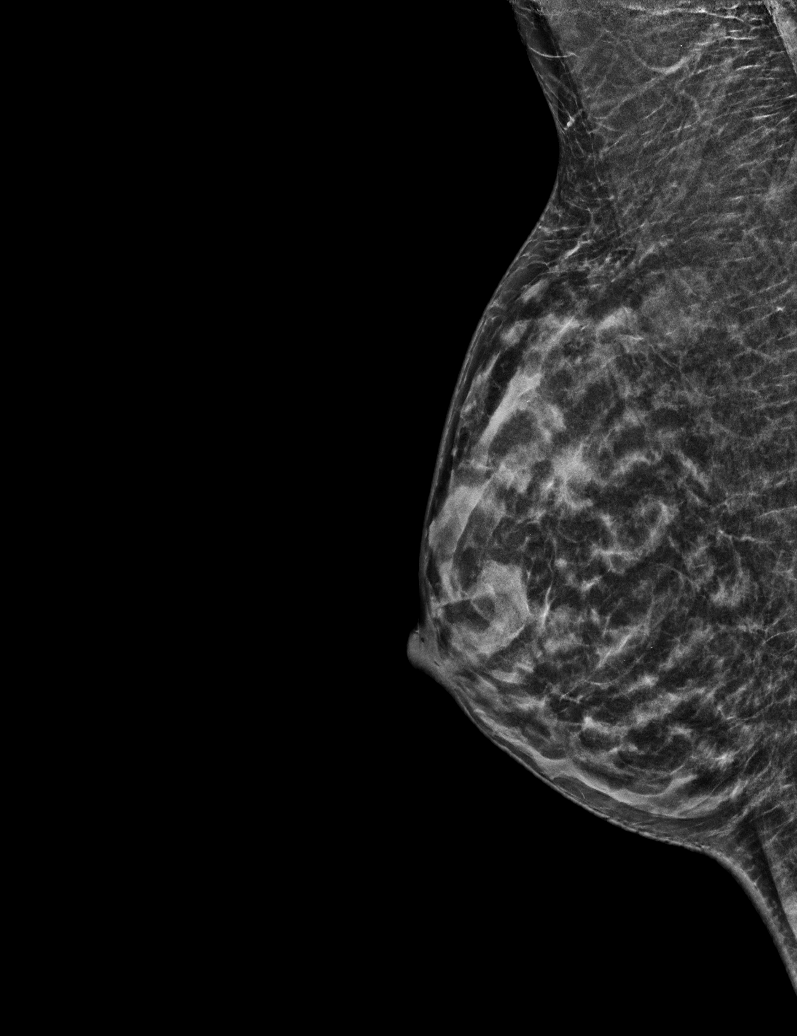

[L MLO synth-2D]
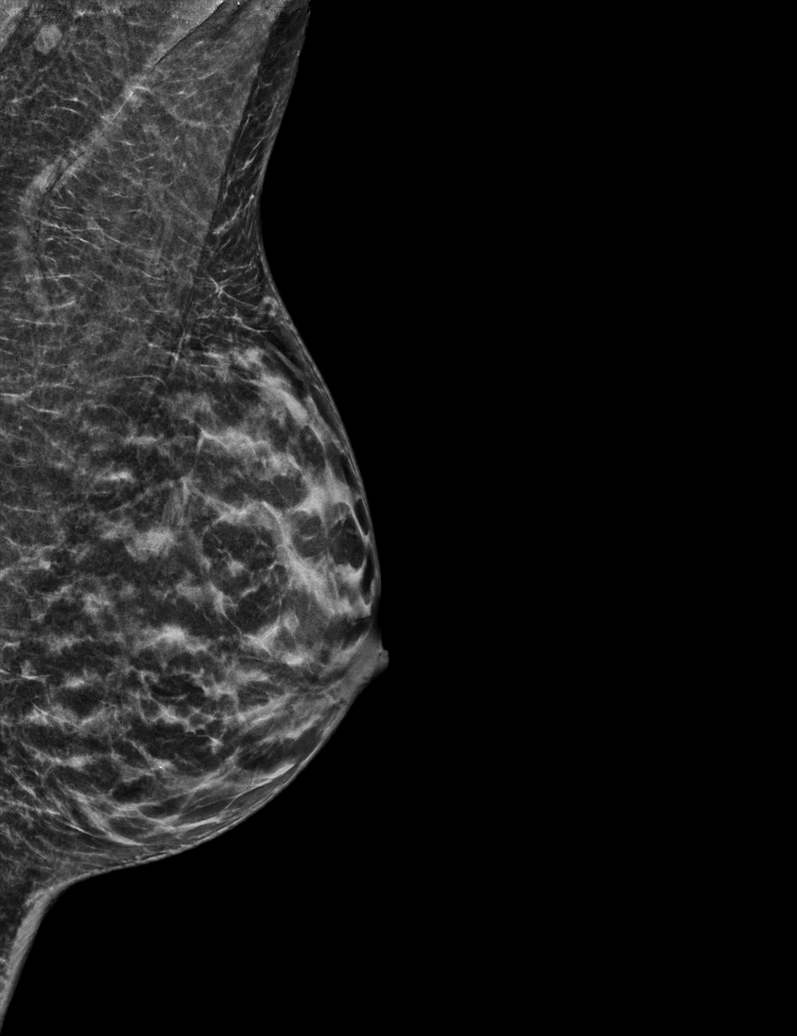

[L CC synth-2D]
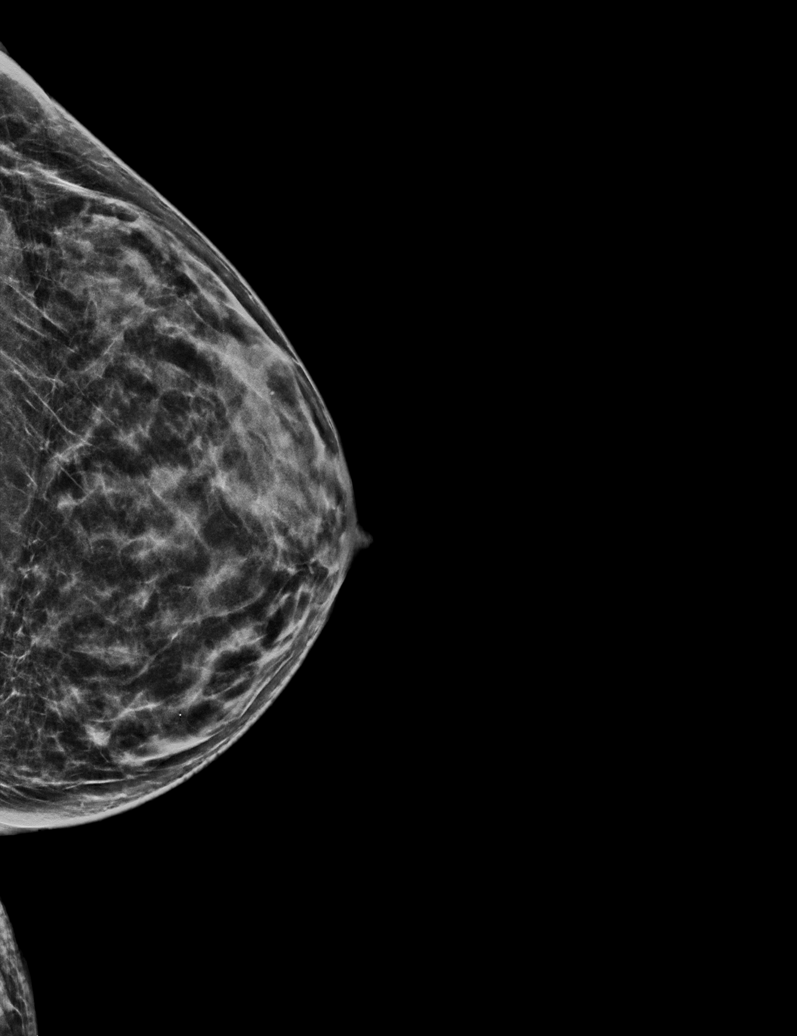

[R CC synth-2D (1 of 2)]
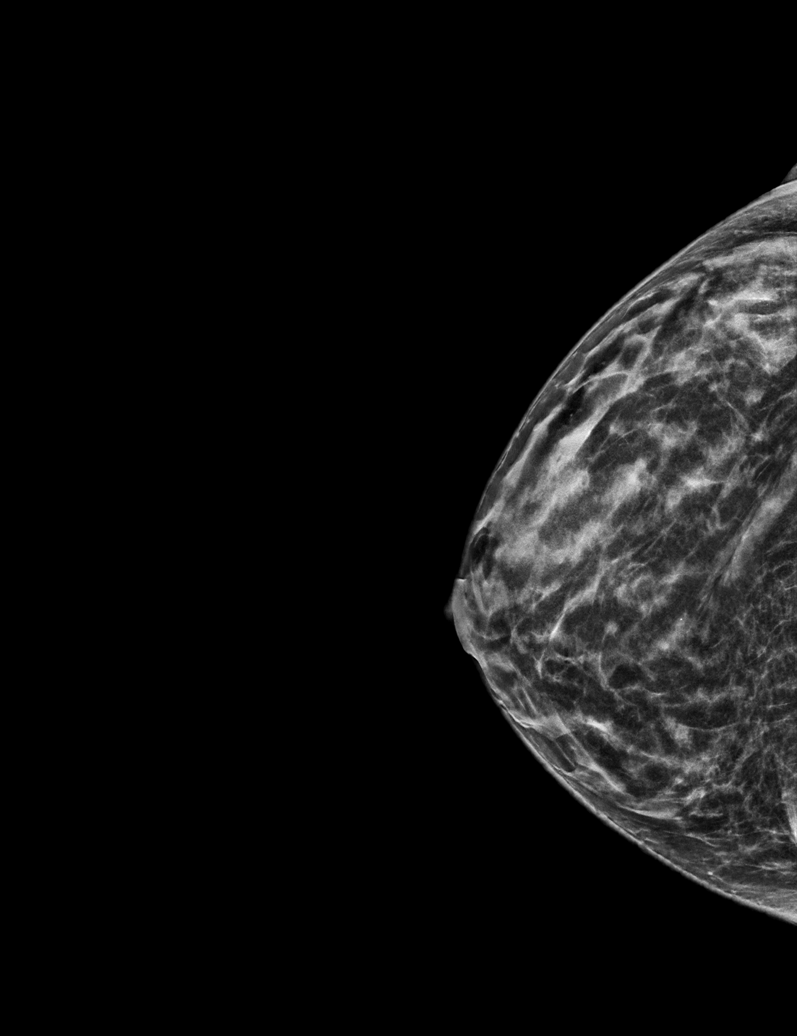

[R CC synth-2D (2 of 2)]
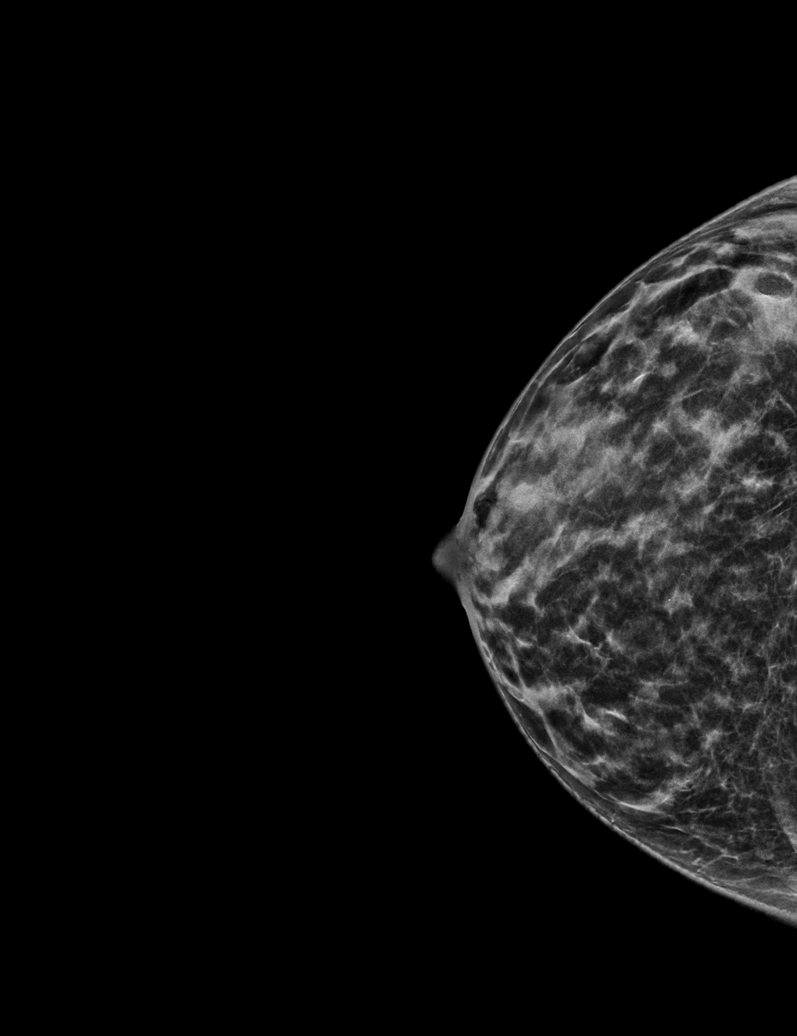

[R CC tomo · tomo slice 24/47.0]
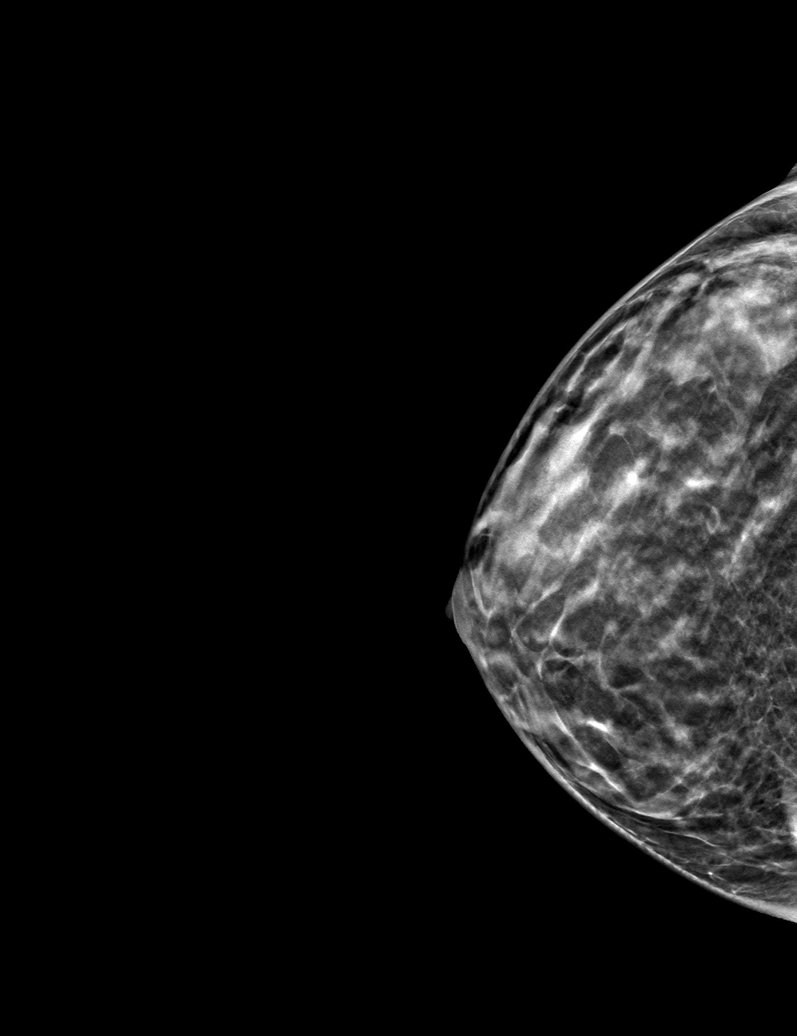

[6 of 30 positions shown; findings below may reference images not displayed]

ACR Breast Density Category c: The breast tissue is heterogeneously
dense, which may obscure small masses
FINDINGS: There are no findings suspicious for malignancy. The images were
evaluated with computer-aided detection.
IMPRESSION: No mammographic evidence of malignancy. A result letter of this
screening mammogram will be mailed directly to the patient.

RECOMMENDATION:
Screening mammogram at age 42. (Code:Q2-X-9CS)

BI-RADS CATEGORY  1: Negative.

## 2023-08-16 ENCOUNTER — Ambulatory Visit: Payer: Commercial Managed Care - PPO

## 2023-08-16 VITALS — BP 130/74 | HR 74 | Temp 97.1°F | Ht 69.0 in | Wt 150.0 lb

## 2023-08-16 DIAGNOSIS — R109 Unspecified abdominal pain: Secondary | ICD-10-CM | POA: Insufficient documentation

## 2023-08-16 DIAGNOSIS — R197 Diarrhea, unspecified: Secondary | ICD-10-CM | POA: Insufficient documentation

## 2023-08-16 MED ORDER — DICYCLOMINE HCL 10 MG PO CAPS: 10.0000 mg | ORAL_CAPSULE | Freq: Three times a day (TID) | ORAL | 0 refills | Status: DC

## 2023-08-16 NOTE — Progress Notes (Signed)
 Acute Office Visit  Subjective:    Patient ID: Emily Holt, female    DOB: [birth year], 43 y.o.   MRN: [id number]  Chief Complaint  Patient presents with   Bloated    Discussed the use of AI scribe software for clinical note transcription with the patient, who gave verbal consent to proceed.      HPI: The patient, with a history of lactose intolerance in childhood, presents with a two-week history of abdominal discomfort and diarrhea. She describes a sensation of pressure and cramping in the upper abdomen, likening it to the aftermath of an intense abdominal workout. This discomfort is particularly noticeable after eating, regardless of the type of food consumed, and is often accompanied by a gurgling sensation in the stomach.  The patient reports that bowel movements, which have increased in frequency and are notably loose, provide some relief from the discomfort, but the relief is temporary and incomplete. She denies any blood or mucus in the stool. Despite the discomfort, the patient has not experienced any weight loss and denies any nocturnal symptoms.  The patient also reports a history of issues with an intrauterine device (IUD), including irregular periods and cramping, which led to its removal 2 days ago.  However, the abdominal discomfort persisted after the removal of the IUD, ruling it out as a potential cause.  The patient's symptoms have significantly impacted her daily life, causing her to avoid eating out of fear of needing immediate access to a bathroom. She denies any changes in diet or lifestyle that could have triggered these symptoms. The patient has a history of lactose intolerance but reports that dairy products do not exacerbate her current symptoms.  The patient denies any significant family history of gastrointestinal diseases, such as inflammatory bowel disease or Crohn's disease. She does not take any medications and has a sporadic alcohol consumption  pattern. The patient's symptoms have been severe enough to disrupt her work and daily activities, leading to a significant decrease in her quality of life.  Past Medical History:  Diagnosis Date   Cystitis    Dermatitis due to plant     Past Surgical History:  Procedure Laterality Date   TONSILLECTOMY  11/18/2003    Family History  Problem Relation Age of Onset   Hypertension Mother    Diabetes Maternal Grandmother    Diabetes Maternal Grandfather    Colon cancer Maternal Grandfather     Social History   Socioeconomic History   Marital status: Single    Spouse name: Not on file   Number of children: 0   Years of education: Not on file   Highest education level: Not on file  Occupational History   Not on file  Tobacco Use   Smoking status: Never   Smokeless tobacco: Never  Vaping Use   Vaping status: Never Used  Substance and Sexual Activity   Alcohol use: Yes    Alcohol/week: 2.0 standard drinks of alcohol    Types: 2 Cans of beer per week    Comment: week   Drug use: Never   Sexual activity: Yes    Partners: Male    Birth control/protection: I.U.D.  Other Topics Concern   Not on file  Social History Narrative   Not on file   Social Drivers of Health   Financial Resource Strain: Low Risk  (08/16/2023)   Overall Financial Resource Strain (CARDIA)    Difficulty of Paying Living Expenses: Not hard at all  Food Insecurity: No Food  Insecurity (08/16/2023)   Hunger Vital Sign    Worried About Running Out of Food in the Last Year: Never true    Ran Out of Food in the Last Year: Never true  Transportation Needs: No Transportation Needs (08/16/2023)   PRAPARE - Administrator, Civil Service (Medical): No    Lack of Transportation (Non-Medical): No  Physical Activity: Sufficiently Active (08/16/2023)   Exercise Vital Sign    Days of Exercise per Week: 5 days    Minutes of Exercise per Session: 60 min  Stress: No Stress Concern Present (08/16/2023)   [employer] - Occupational Stress Questionnaire    Feeling of Stress : Not at all  Social Connections: Socially Isolated (08/16/2023)   Social Connection and Isolation Panel [NHANES]    Frequency of Communication with Friends and Family: More than three times a week    Frequency of Social Gatherings with Friends and Family: More than three times a week    Attends Religious Services: Never    Database Administrator or Organizations: No    Attends Banker Meetings: Never    Marital Status: Never married  Intimate Partner Violence: Not At Risk (08/16/2023)   Humiliation, Afraid, Rape, and Kick questionnaire    Fear of Current or Ex-Partner: No    Emotionally Abused: No    Physically Abused: No    Sexually Abused: No    Outpatient Medications Prior to Visit  Medication Sig Dispense Refill   EPINEPHrine  (EPIPEN  2-PAK) 0.3 mg/0.3 mL IJ SOAJ injection Inject 0.3 mg into the muscle as needed for anaphylaxis. 2 each 1   Olopatadine-Mometasone (RYALTRIS ) 665-25 MCG/ACT SUSP 2 sprays each nostril 1-2 times a day as needed 29 g 5   No facility-administered medications prior to visit.    Allergies  Allergen Reactions   Fire Ant Anaphylaxis    Review of Systems  Constitutional:  Positive for appetite change. Negative for chills, fatigue and fever.  Gastrointestinal:  Positive for abdominal pain, diarrhea and nausea. Negative for constipation and vomiting.  Genitourinary:  Negative for dysuria and urgency.       Objective:        08/16/2023    8:39 AM 08/24/2022    8:37 AM 08/15/2022    2:33 PM  Vitals with BMI  Height 5' 9  5' 7  Weight 150 lbs  131 lbs 6 oz  BMI 22.14  20.58  Systolic 130 112 891  Diastolic 74 60 62  Pulse 74 90 63    No data found.   Physical Exam Vitals and nursing note reviewed.  Constitutional:      Appearance: Normal appearance.  HENT:     Head: Normocephalic and atraumatic.     Nose: Nose normal.     Mouth/Throat:      Mouth: Mucous membranes are moist.  Cardiovascular:     Rate and Rhythm: Normal rate and regular rhythm.  Pulmonary:     Effort: Pulmonary effort is normal.     Breath sounds: Normal breath sounds.  Abdominal:     General: There is no distension.     Palpations: There is no mass.     Tenderness: There is no abdominal tenderness. There is no guarding or rebound.     Hernia: No hernia is present.     Comments: Hyperactive bowel sounds  Musculoskeletal:        General: Normal range of motion.     Cervical  back: Normal range of motion.  Neurological:     General: No focal deficit present.     Mental Status: She is alert.  Psychiatric:        Mood and Affect: Mood normal.     Health Maintenance Due  Topic Date Due   DTaP/Tdap/Td (1 - Tdap) Never done   COVID-19 Vaccine (1 - 2024-25 season) Never done    There are no preventive care reminders to display for this patient.   Lab Results  Component Value Date   TSH 1.930 06/06/2022   Lab Results  Component Value Date   WBC 7.1 06/06/2022   HGB 12.1 06/06/2022   HCT 36.2 06/06/2022   MCV 91 06/06/2022   PLT 307 06/06/2022   Lab Results  Component Value Date   NA 138 06/06/2022   K 5.2 06/06/2022   CO2 24 06/06/2022   GLUCOSE 96 06/06/2022   BUN 14 06/06/2022   CREATININE 0.67 06/06/2022   BILITOT 0.2 06/06/2022   ALKPHOS 58 06/06/2022   AST 18 06/06/2022   ALT 15 06/06/2022   PROT 7.2 06/06/2022   ALBUMIN 4.7 06/06/2022   CALCIUM 9.6 06/06/2022   EGFR 114 06/06/2022   Lab Results  Component Value Date   CHOL 152 06/06/2022   Lab Results  Component Value Date   HDL 98 06/06/2022   Lab Results  Component Value Date   LDLCALC 38 06/06/2022   Lab Results  Component Value Date   TRIG 89 06/06/2022   Lab Results  Component Value Date   CHOLHDL 1.6 06/06/2022   No results found for: HGBA1C     Assessment & Plan:  Acute abdominal pain Assessment & Plan: Presents with a two-week history of upper  abdominal pressure, cramping, and gurgling, exacerbated by eating. Symptoms include frequent loose stools without blood or mucus. No significant weight loss or appetite changes.   Differential diagnosis includes IBS, viral or bacterial gastroenteritis, food intolerances, and gallbladder disease.   Physical exam reveals hyperactive bowel sounds without tenderness or masses.   Discussed that IBS diagnosis requires symptoms for at least three months. Viral gastroenteritis should not last this long, and bacterial gastroenteritis usually causes severe pain and blood in stools. Food poisoning typically resolves within 24 hours.  Inflammatory bowel diseases like Crohn's and ulcerative colitis are less likely without family history. Lactose intolerance and celiac disease were considered but deemed unlikely based on history.   Initial blood work and ultrasound of the gallbladder are planned to rule out other causes. Discussed potential use of dicyclomine  to manage symptoms and the importance of a food diary and elimination diet.  - Order CBC with differential - Order CMP - Order amylase and lipase - Order sed rate and CRP - Order abdominal ultrasound - Prescribe dicyclomine  10 mg capsule, to be taken up to three times daily before meals and at bedtime - Recommend starting a food diary - Advise trying an elimination diet - Recommend taking a probiotic such as Florajen - Advise maintaining good hydration - Instruct to seek emergency care if pain becomes severe (8/10)  IUD-related Symptoms Reports irregular periods and cramping related to her IUD, which has since been removed. Symptoms are not believed to be related to the current abdominal issues.  General Health Maintenance Does not smoke or drink alcohol regularly and has no family history of inflammatory bowel disease. Generally healthy aside from the current acute issues. - Encourage adequate water intake  Follow-up - Follow up with blood  work results - Follow up with ultrasound results - Monitor symptoms and effectiveness of dicyclomine  - Schedule follow-up appointment if symptoms persist or worsen.  Orders: -     CBC with Differential/Platelet -     Comprehensive metabolic panel -     Amylase -     Lipase -     Sedimentation rate -     C-reactive protein -     US  ABDOMEN LIMITED RUQ (LIVER/GB); Future  Abdominal cramping Assessment & Plan: Presents with a two-week history of upper abdominal pressure, cramping, and gurgling, exacerbated by eating. Symptoms include frequent loose stools without blood or mucus. No significant weight loss or appetite changes.   Differential diagnosis includes IBS, viral or bacterial gastroenteritis, food intolerances, and gallbladder disease.   Physical exam reveals hyperactive bowel sounds without tenderness or masses.   Discussed that IBS diagnosis requires symptoms for at least three months. Viral gastroenteritis should not last this long, and bacterial gastroenteritis usually causes severe pain and blood in stools. Food poisoning typically resolves within 24 hours.  Inflammatory bowel diseases like Crohn's and ulcerative colitis are less likely without family history. Lactose intolerance and celiac disease were considered but deemed unlikely based on history.   Initial blood work and ultrasound of the gallbladder are planned to rule out other causes. Discussed potential use of dicyclomine  to manage symptoms and the importance of a food diary and elimination diet.  - Order CBC with differential - Order CMP - Order amylase and lipase - Order sed rate and CRP - Order abdominal ultrasound - Prescribe dicyclomine  10 mg capsule, to be taken up to three times daily before meals and at bedtime - Recommend starting a food diary - Advise trying an elimination diet - Recommend taking a probiotic such as Florajen - Advise maintaining good hydration - Instruct to seek emergency care if pain  becomes severe (8/10)  IUD-related Symptoms Reports irregular periods and cramping related to her IUD, which has since been removed. Symptoms are not believed to be related to the current abdominal issues.  General Health Maintenance Does not smoke or drink alcohol regularly and has no family history of inflammatory bowel disease. Generally healthy aside from the current acute issues. - Encourage adequate water intake  Follow-up - Follow up with blood work results - Follow up with ultrasound results - Monitor symptoms and effectiveness of dicyclomine  - Schedule follow-up appointment if symptoms persist or worsen.  Orders: -     CBC with Differential/Platelet -     Comprehensive metabolic panel -     Amylase -     Lipase -     Sedimentation rate -     C-reactive protein -     US  ABDOMEN LIMITED RUQ (LIVER/GB); Future  Diarrhea, unspecified type -     CBC with Differential/Platelet -     Comprehensive metabolic panel -     Amylase -     Lipase -     Sedimentation rate -     C-reactive protein -     US  ABDOMEN LIMITED RUQ (LIVER/GB); Future  Other orders -     Dicyclomine  HCl; Take 1 capsule (10 mg total) by mouth 4 (four) times daily -  before meals and at bedtime for 7 days.  Dispense: 28 capsule; Refill: 0     Meds ordered this encounter  Medications   dicyclomine  (BENTYL ) 10 MG capsule    Sig: Take 1 capsule (10 mg total) by mouth 4 (four) times daily -  before meals and at bedtime for 7 days.    Dispense:  28 capsule    Refill:  0    Orders Placed This Encounter  Procedures   US  Abdomen Limited RUQ (LIVER/GB)   CBC with Differential/Platelet   Comprehensive metabolic panel   Amylase   Lipase   Sedimentation Rate   C-reactive protein     Follow-up: Return if symptoms worsen or fail to improve.  An After Visit Summary was printed and given to the patient.  Saretta Dahlem, MD [facility] [phone]

## 2023-08-16 NOTE — Assessment & Plan Note (Signed)
 Presents with a two-week history of upper abdominal pressure, cramping, and gurgling, exacerbated by eating. Symptoms include frequent loose stools without blood or mucus. No significant weight loss or appetite changes.   Differential diagnosis includes IBS, viral or bacterial gastroenteritis, food intolerances, and gallbladder disease.   Physical exam reveals hyperactive bowel sounds without tenderness or masses.   Discussed that IBS diagnosis requires symptoms for at least three months. Viral gastroenteritis should not last this long, and bacterial gastroenteritis usually causes severe pain and blood in stools. Food poisoning typically resolves within 24 hours.  Inflammatory bowel diseases like Crohn's and ulcerative colitis are less likely without family history. Lactose intolerance and celiac disease were considered but deemed unlikely based on history.   Initial blood work and ultrasound of the gallbladder are planned to rule out other causes. Discussed potential use of dicyclomine  to manage symptoms and the importance of a food diary and elimination diet.  - Order CBC with differential - Order CMP - Order amylase and lipase - Order sed rate and CRP - Order abdominal ultrasound - Prescribe dicyclomine  10 mg capsule, to be taken up to three times daily before meals and at bedtime - Recommend starting a food diary - Advise trying an elimination diet - Recommend taking a probiotic such as Florajen - Advise maintaining good hydration - Instruct to seek emergency care if pain becomes severe (8/10)  IUD-related Symptoms Reports irregular periods and cramping related to her IUD, which has since been removed. Symptoms are not believed to be related to the current abdominal issues.  General Health Maintenance Does not smoke or drink alcohol regularly and has no family history of inflammatory bowel disease. Generally healthy aside from the current acute issues. - Encourage adequate water  intake  Follow-up - Follow up with blood work results - Follow up with ultrasound results - Monitor symptoms and effectiveness of dicyclomine  - Schedule follow-up appointment if symptoms persist or worsen.

## 2023-08-16 NOTE — Patient Instructions (Signed)
 VISIT SUMMARY:  You came in today with a two-week history of abdominal discomfort and diarrhea. You described a sensation of pressure and cramping in your upper abdomen, which worsens after eating. Despite these symptoms, you have not experienced any weight loss or changes in appetite. We discussed your history of lactose intolerance and issues with an intrauterine device (IUD), but these do not seem to be related to your current symptoms. Your symptoms have significantly impacted your daily life, causing you to avoid eating out of fear of needing immediate access to a bathroom.  YOUR PLAN:  -ABDOMINAL PAIN WITH DIARRHEA: Your symptoms include upper abdominal pressure, cramping, and frequent loose stools without blood or mucus. We are considering several possible causes, including irritable bowel syndrome (IBS), gastroenteritis, and gallbladder disease. We will start with some blood tests and an abdominal ultrasound to rule out other causes. You have been prescribed dicyclomine  10 mg to take up to three times daily before meals and at bedtime to help manage your symptoms. Additionally, we recommend starting a food diary and trying an elimination diet to identify any potential food triggers. Taking a probiotic and maintaining good hydration are also advised. Please seek emergency care if your pain becomes severe (8/10).  -IUD-RELATED SYMPTOMS: You reported irregular periods and cramping related to your IUD, which has since been removed. These symptoms are not believed to be related to your current abdominal issues.  -GENERAL HEALTH MAINTENANCE: You do not smoke or drink alcohol regularly and have no family history of inflammatory bowel disease. We encourage you to maintain adequate water intake to support your overall health.  INSTRUCTIONS:  Please follow up with us  once your blood work and ultrasound results are available. Monitor your symptoms and the effectiveness of dicyclomine . Schedule a follow-up  appointment if your symptoms persist or worsen.

## 2023-08-17 LAB — COMPREHENSIVE METABOLIC PANEL
ALT: 18 [IU]/L (ref 0–32)
AST: 21 [IU]/L (ref 0–40)
Albumin: 4.7 g/dL (ref 3.9–4.9)
Alkaline Phosphatase: 57 [IU]/L (ref 44–121)
BUN/Creatinine Ratio: 17 (ref 9–23)
BUN: 13 mg/dL (ref 6–24)
Bilirubin Total: 0.3 mg/dL (ref 0.0–1.2)
CO2: 24 mmol/L (ref 20–29)
Calcium: 9.5 mg/dL (ref 8.7–10.2)
Chloride: 102 mmol/L (ref 96–106)
Creatinine, Ser: 0.75 mg/dL (ref 0.57–1.00)
Globulin, Total: 2.3 g/dL (ref 1.5–4.5)
Glucose: 83 mg/dL (ref 70–99)
Potassium: 4.9 mmol/L (ref 3.5–5.2)
Sodium: 142 mmol/L (ref 134–144)
Total Protein: 7 g/dL (ref 6.0–8.5)
eGFR: 103 mL/min/{1.73_m2} (ref >59)

## 2023-08-17 LAB — CBC WITH DIFFERENTIAL/PLATELET
Basophils Absolute: 0.1 10*3/uL (ref 0.0–0.2)
Basos: 2 %
EOS (ABSOLUTE): 0.1 10*3/uL (ref 0.0–0.4)
Eos: 3 %
Hematocrit: 37.8 % (ref 34.0–46.6)
Hemoglobin: 12.6 g/dL (ref 11.1–15.9)
Immature Grans (Abs): 0 10*3/uL (ref 0.0–0.1)
Immature Granulocytes: 0 %
Lymphocytes Absolute: 2.3 10*3/uL (ref 0.7–3.1)
Lymphs: 48 %
MCH: 31.4 pg (ref 26.6–33.0)
MCHC: 33.3 g/dL (ref 31.5–35.7)
MCV: 94 fL (ref 79–97)
Monocytes Absolute: 0.4 10*3/uL (ref 0.1–0.9)
Monocytes: 8 %
Neutrophils Absolute: 1.8 10*3/uL (ref 1.4–7.0)
Neutrophils: 39 %
Platelets: 318 10*3/uL (ref 150–450)
RBC: 4.01 x10E6/uL (ref 3.77–5.28)
RDW: 12.2 % (ref 11.7–15.4)
WBC: 4.6 10*3/uL (ref 3.4–10.8)

## 2023-08-17 LAB — C-REACTIVE PROTEIN: CRP: <1 mg/L (ref 0–10)

## 2023-08-17 LAB — SEDIMENTATION RATE: Sed Rate: 2 mm/h (ref 0–32)

## 2023-08-17 LAB — AMYLASE: Amylase: 84 U/L (ref 31–110)

## 2023-08-17 LAB — LIPASE: Lipase: 33 U/L (ref 14–72)

## 2024-07-10 ENCOUNTER — Ambulatory Visit: Admitting: Family Medicine

## 2024-07-10 ENCOUNTER — Ambulatory Visit: Payer: Self-pay

## 2024-07-10 ENCOUNTER — Encounter: Payer: Self-pay | Admitting: Family Medicine

## 2024-07-10 VITALS — BP 130/64 | HR 80 | Temp 98.0°F | Resp 16 | Ht 69.0 in | Wt 148.6 lb

## 2024-07-10 DIAGNOSIS — N898 Other specified noninflammatory disorders of vagina: Secondary | ICD-10-CM | POA: Insufficient documentation

## 2024-07-10 LAB — POCT URINALYSIS DIP (CLINITEK)
Bilirubin, UA: NEGATIVE
Blood, UA: NEGATIVE
Glucose, UA: NEGATIVE mg/dL
Ketones, POC UA: NEGATIVE mg/dL
Nitrite, UA: NEGATIVE
POC PROTEIN,UA: NEGATIVE
Spec Grav, UA: <=1.005 — AB (ref 1.010–1.025)
Urobilinogen, UA: 0.2 U/dL
pH, UA: 6 (ref 5.0–8.0)

## 2024-07-10 NOTE — Progress Notes (Signed)
 Acute Office Visit  Subjective:    Patient ID: Emily Holt, female    DOB: [birth year], 43 y.o.   MRN: [id number]  Chief Complaint  Patient presents with   Vaginal Discharge   Discussed the use of AI scribe software for clinical note transcription with the patient, who gave verbal consent to proceed.  History of Present Illness   Emily Holt is a 43 year old female who presents with persistent vaginal discharge.  Abnormal vaginal discharge - Persistent vaginal discharge present for an extended period - Discharge described as 'disgusting' and 'not normal' - Recent change in color to 'kind of greenish' - Associated malodor - Discharge is constant and not limited to menstrual periods  Vaginal pruritus - New onset vaginal itching beginning a few days ago - Itching is a recent symptom accompanying the discharge  Prior gynecologic infections and treatments - Similar episode approximately two years ago - Initially treated for yeast infection without improvement - Subsequently treated for bacterial vaginosis, possibly with Flagyl - Persistent discharge following previous treatments  Sexual and reproductive history - Annual STD testing with all results negative - IUD removed - Significant other has had a vasectomy, eliminating pregnancy concerns        Past Medical History:  Diagnosis Date   Cystitis    Dermatitis due to plant     Past Surgical History:  Procedure Laterality Date   TONSILLECTOMY  11/18/2003    Family History  Problem Relation Age of Onset   Hypertension Mother    Diabetes Maternal Grandmother    Diabetes Maternal Grandfather    Colon cancer Maternal Grandfather     Social History   Socioeconomic History   Marital status: Single    Spouse name: Not on file   Number of children: 0   Years of education: Not on file   Highest education level: Not on file  Occupational History   Not on file  Tobacco Use   Smoking status: Never    Smokeless tobacco: Never  Vaping Use   Vaping status: Never Used  Substance and Sexual Activity   Alcohol use: Yes    Alcohol/week: 2.0 standard drinks of alcohol    Types: 2 Cans of beer per week    Comment: week   Drug use: Never   Sexual activity: Yes    Partners: Male    Birth control/protection: I.U.D.  Other Topics Concern   Not on file  Social History Narrative   Not on file   Social Drivers of Health   Financial Resource Strain: Low Risk  (08/16/2023)   Overall Financial Resource Strain (CARDIA)    Difficulty of Paying Living Expenses: Not hard at all  Food Insecurity: No Food Insecurity (08/16/2023)   Hunger Vital Sign    Worried About Running Out of Food in the Last Year: Never true    Ran Out of Food in the Last Year: Never true  Transportation Needs: No Transportation Needs (08/16/2023)   PRAPARE - Administrator, Civil Service (Medical): No    Lack of Transportation (Non-Medical): No  Physical Activity: Sufficiently Active (08/16/2023)   Exercise Vital Sign    Days of Exercise per Week: 5 days    Minutes of Exercise per Session: 60 min  Stress: No Stress Concern Present (08/16/2023)   [employer] of Occupational Health - Occupational Stress Questionnaire    Feeling of Stress : Not at all  Social Connections: Socially Isolated (08/16/2023)   Social Connection and  Isolation Panel    Frequency of Communication with Friends and Family: More than three times a week    Frequency of Social Gatherings with Friends and Family: More than three times a week    Attends Religious Services: Never    Database Administrator or Organizations: No    Attends Banker Meetings: Never    Marital Status: Never married  Intimate Partner Violence: Not At Risk (08/16/2023)   Humiliation, Afraid, Rape, and Kick questionnaire    Fear of Current or Ex-Partner: No    Emotionally Abused: No    Physically Abused: No    Sexually Abused: No    Outpatient Medications  Prior to Visit  Medication Sig Dispense Refill   EPINEPHrine  (EPIPEN  2-PAK) 0.3 mg/0.3 mL IJ SOAJ injection Inject 0.3 mg into the muscle as needed for anaphylaxis. 2 each 1   dicyclomine  (BENTYL ) 10 MG capsule Take 1 capsule (10 mg total) by mouth 4 (four) times daily -  before meals and at bedtime for 7 days. 28 capsule 0   No facility-administered medications prior to visit.    Allergies  Allergen Reactions   Fire Ant Anaphylaxis    Review of Systems  Constitutional:  Negative for chills, diaphoresis, fatigue and fever.  HENT:  Negative for congestion, ear pain and sinus pain.   Eyes: Negative.   Respiratory:  Negative for cough and shortness of breath.   Cardiovascular:  Negative for chest pain.  Gastrointestinal:  Negative for abdominal pain, constipation, nausea and vomiting.  Endocrine: Negative.   Genitourinary:  Positive for vaginal discharge. Negative for dysuria.  Musculoskeletal:  Negative for arthralgias.  Allergic/Immunologic: Negative.   Neurological:  Negative for dizziness, light-headedness and headaches.  Psychiatric/Behavioral:  Negative for dysphoric mood. The patient is not nervous/anxious.        Objective:        07/10/2024    1:24 PM 08/16/2023    8:39 AM 08/24/2022    8:37 AM  Vitals with BMI  Height 5' 9 5' 9   Weight 148 lbs 10 oz 150 lbs   BMI 21.93 22.14   Systolic 130 130 887  Diastolic 64 74 60  Pulse 80 74 90    No data found.   Physical Exam Vitals reviewed.  Constitutional:      General: She is not in acute distress.    Appearance: Normal appearance.  Eyes:     Conjunctiva/sclera: Conjunctivae normal.  Cardiovascular:     Rate and Rhythm: Normal rate and regular rhythm.     Heart sounds: Normal heart sounds.  Pulmonary:     Effort: Pulmonary effort is normal.     Breath sounds: Normal breath sounds. No wheezing.  Abdominal:     Palpations: Abdomen is soft.  Genitourinary:    Comments: Erythema noted on exam - obtained  vaginal swab, tolerated well without complications  Neurological:     Mental Status: She is alert. Mental status is at baseline.  Psychiatric:        Mood and Affect: Mood normal.        Behavior: Behavior normal.     Health Maintenance Due  Topic Date Due   DTaP/Tdap/Td (1 - Tdap) Never done   Hepatitis B Vaccines 19-59 Average Risk (1 of 3 - 19+ 3-dose series) Never done   HPV VACCINES (1 - 3-dose SCDM series) Never done   Mammogram  12/24/2022   Influenza Vaccine  Never done   COVID-19 Vaccine (1 -  2025-26 season) Never done       Topic Date Due   Hepatitis B Vaccines 19-59 Average Risk (1 of 3 - 19+ 3-dose series) Never done   HPV VACCINES (1 - 3-dose SCDM series) Never done     Lab Results  Component Value Date   TSH 1.930 06/06/2022   Lab Results  Component Value Date   WBC 4.6 08/16/2023   HGB 12.6 08/16/2023   HCT 37.8 08/16/2023   MCV 94 08/16/2023   PLT 318 08/16/2023   Lab Results  Component Value Date   NA 142 08/16/2023   K 4.9 08/16/2023   CO2 24 08/16/2023   GLUCOSE 83 08/16/2023   BUN 13 08/16/2023   CREATININE 0.75 08/16/2023   BILITOT 0.3 08/16/2023   ALKPHOS 57 08/16/2023   AST 21 08/16/2023   ALT 18 08/16/2023   PROT 7.0 08/16/2023   ALBUMIN 4.7 08/16/2023   CALCIUM 9.5 08/16/2023   EGFR 103 08/16/2023   Lab Results  Component Value Date   CHOL 152 06/06/2022   Lab Results  Component Value Date   HDL 98 06/06/2022   Lab Results  Component Value Date   LDLCALC 38 06/06/2022   Lab Results  Component Value Date   TRIG 89 06/06/2022   Lab Results  Component Value Date   CHOLHDL 1.6 06/06/2022   No results found for: HGBA1C      Results for orders placed or performed in visit on 08/16/23  CBC with Differential/Platelet   Collection Time: 08/16/23  9:14 AM  Result Value Ref Range   WBC 4.6 3.4 - 10.8 x10E3/uL   RBC 4.01 3.77 - 5.28 x10E6/uL   Hemoglobin 12.6 11.1 - 15.9 g/dL   Hematocrit 62.1 65.9 - 46.6 %   MCV 94  79 - 97 fL   MCH 31.4 26.6 - 33.0 pg   MCHC 33.3 31.5 - 35.7 g/dL   RDW 87.7 88.2 - 84.5 %   Platelets 318 150 - 450 x10E3/uL   Neutrophils 39 Not Estab. %   Lymphs 48 Not Estab. %   Monocytes 8 Not Estab. %   Eos 3 Not Estab. %   Basos 2 Not Estab. %   Neutrophils Absolute 1.8 1.4 - 7.0 x10E3/uL   Lymphocytes Absolute 2.3 0.7 - 3.1 x10E3/uL   Monocytes Absolute 0.4 0.1 - 0.9 x10E3/uL   EOS (ABSOLUTE) 0.1 0.0 - 0.4 x10E3/uL   Basophils Absolute 0.1 0.0 - 0.2 x10E3/uL   Immature Granulocytes 0 Not Estab. %   Immature Grans (Abs) 0.0 0.0 - 0.1 x10E3/uL  Comprehensive metabolic panel   Collection Time: 08/16/23  9:14 AM  Result Value Ref Range   Glucose 83 70 - 99 mg/dL   BUN 13 6 - 24 mg/dL   Creatinine, Ser 9.24 0.57 - 1.00 mg/dL   eGFR 896 >40 fO/fpw/8.26   BUN/Creatinine Ratio 17 9 - 23   Sodium 142 134 - 144 mmol/L   Potassium 4.9 3.5 - 5.2 mmol/L   Chloride 102 96 - 106 mmol/L   CO2 24 20 - 29 mmol/L   Calcium 9.5 8.7 - 10.2 mg/dL   Total Protein 7.0 6.0 - 8.5 g/dL   Albumin 4.7 3.9 - 4.9 g/dL   Globulin, Total 2.3 1.5 - 4.5 g/dL   Bilirubin Total 0.3 0.0 - 1.2 mg/dL   Alkaline Phosphatase 57 44 - 121 IU/L   AST 21 0 - 40 IU/L   ALT 18 0 - 32 IU/L  Amylase  Collection Time: 08/16/23  9:14 AM  Result Value Ref Range   Amylase 84 31 - 110 U/L  Lipase   Collection Time: 08/16/23  9:14 AM  Result Value Ref Range   Lipase 33 14 - 72 U/L  Sedimentation Rate   Collection Time: 08/16/23  9:14 AM  Result Value Ref Range   Sed Rate 2 0 - 32 mm/hr  C-reactive protein   Collection Time: 08/16/23  9:14 AM  Result Value Ref Range   CRP <1 0 - 10 mg/L     Assessment & Plan:   Assessment & Plan Foul smelling vaginal discharge Chronic vaginal discharge with suspected infection Chronic greenish, odorous discharge unresponsive to previous bacterial vaginosis treatment. Differential includes bacterial vaginosis and mycoplasma infection. No recent STDs. Partner's vasectomy  and IUD removal exclude pregnancy. Persistent symptoms suggest reinfection or untreated partner. UA showed Lab Results  Component Value Date   COLORU yellow 07/10/2024   CLARITYU cloudy (A) 07/10/2024   GLUCOSEUR negative 07/10/2024   BILIRUBINUR negative 07/10/2024   SPECGRAV <=1.005 (A) 07/10/2024   RBCUR negative 07/10/2024   PHUR 6.0 07/10/2024   UROBILINOGEN 0.2 07/10/2024   LEUKOCYTESUR Large (3+) (A) 07/10/2024  - Urine culture sent  - Sent swab for culture to identify organism. - Tested for bacterial vaginosis, candidiasis and mycoplasma infection. - Prescribe appropriate medication based on offending organism. - Coordinate partner treatment if required.  Orders:   POCT URINALYSIS DIP (CLINITEK)   NuSwab Vaginitis Plus (VG+)   M genitalium NAA, Swab   Follow-up: Return if symptoms worsen or fail to improve.  An After Visit Summary was printed and given to the patient.  Harrie Cedar, FNP [facility] [phone]

## 2024-07-10 NOTE — Addendum Note (Signed)
 Addended by: TERESSA HARRIE HERO on: 07/10/2024 02:35 PM   Modules accepted: Orders

## 2024-07-10 NOTE — Telephone Encounter (Signed)
 FYI Only or Action Required?: FYI only for provider: appointment scheduled on 07/10/2024 at 1:20 PM.  Patient was last seen in primary care on 08/16/2023 by Sirivol, Mamatha, MD.  Called Nurse Triage reporting Vaginal Discharge.  Symptoms began a week ago.  Interventions attempted: Rest, hydration, or home remedies.  Symptoms are: unchanged.  Triage Disposition: See PCP When Office is Open (Within 3 Days)  Patient/caregiver understands and will follow disposition?: Yes  Copied from CRM [id number]. Topic: Clinical - Red Word Triage >> Jul 10, 2024 10:56 AM Joesph NOVAK wrote: Red Word that prompted transfer to Nurse Triage: burning with urination, vaginal discharge, itchiness, redness. Reason for Disposition  Abnormal color vaginal discharge (i.e., yellow, green, gray)  Answer Assessment - Initial Assessment Questions Patient reports she thinks she might have a yeast infection or BV. Endorses green-yellowish vaginal discharge along with itching, redness and burning with urination. Patient scheduled to see another provider in the office today at 1:20 PM.  1. DISCHARGE: Describe the discharge. (e.g., white, yellow, green, gray, foamy, cottage cheese-like)     Green-yellowish 2. ODOR: Is there a bad odor?     yes 3. ONSET: When did the discharge begin?     Found a week ago 4. RASH: Is there a rash in the genital area? If Yes, ask: Describe it. (e.g., redness, blisters, sores, bumps)     no 5. ABDOMEN PAIN: Are you having any abdomen pain? If Yes, ask: What does it feel like?  (e.g., crampy, dull, intermittent, constant)      no 6. ABDOMEN PAIN SEVERITY: If present, ask: How bad is it? (e.g., Scale 1-10; mild, moderate, or severe)     N/A 7. CAUSE: What do you think is causing the discharge? Have you had the same problem before? What happened then?     Patient concerned for BV or yeast infection 8. OTHER SYMPTOMS: Do you have any other symptoms? (e.g., fever, itching,  urination pain, vaginal bleeding, vaginal foreign body)     Itching, redness, burning with urination.  9. PREGNANCY: Is there any chance you are pregnant? When was your last menstrual period?     no  Protocols used: Vaginal Discharge-A-AH

## 2024-07-10 NOTE — Assessment & Plan Note (Signed)
 Chronic vaginal discharge with suspected infection Chronic greenish, odorous discharge unresponsive to previous bacterial vaginosis treatment. Differential includes bacterial vaginosis and mycoplasma infection. No recent STDs. Partner's vasectomy and IUD removal exclude pregnancy. Persistent symptoms suggest reinfection or untreated partner. UA showed Lab Results  Component Value Date   COLORU yellow 07/10/2024   CLARITYU cloudy (A) 07/10/2024   GLUCOSEUR negative 07/10/2024   BILIRUBINUR negative 07/10/2024   SPECGRAV <=1.005 (A) 07/10/2024   RBCUR negative 07/10/2024   PHUR 6.0 07/10/2024   UROBILINOGEN 0.2 07/10/2024   LEUKOCYTESUR Large (3+) (A) 07/10/2024  - Urine culture sent  - Sent swab for culture to identify organism. - Tested for bacterial vaginosis, candidiasis and mycoplasma infection. - Prescribe appropriate medication based on offending organism. - Coordinate partner treatment if required.  Orders:   POCT URINALYSIS DIP (CLINITEK)   NuSwab Vaginitis Plus (VG+)   M genitalium NAA, Swab

## 2024-07-12 ENCOUNTER — Ambulatory Visit: Payer: Self-pay | Admitting: Family Medicine

## 2024-07-12 DIAGNOSIS — B3731 Acute candidiasis of vulva and vagina: Secondary | ICD-10-CM

## 2024-07-12 LAB — URINE CULTURE

## 2024-07-13 LAB — NUSWAB VAGINITIS PLUS (VG+)
Candida albicans, NAA: POSITIVE — AB
Candida glabrata, NAA: NEGATIVE
Chlamydia trachomatis, NAA: NEGATIVE
Neisseria gonorrhoeae, NAA: NEGATIVE
Trich vag by NAA: NEGATIVE

## 2024-07-13 LAB — M GENITALIUM NAA, SWAB: Mycoplasma genitalium NAA: NEGATIVE

## 2024-07-14 MED ORDER — FLUCONAZOLE 200 MG PO TABS: 200.0000 mg | ORAL_TABLET | Freq: Every day | ORAL | 0 refills | Status: AC
